# Patient Record
Sex: Female | Born: 1980 | Race: Black or African American | Hispanic: No | Marital: Married | Smoking: Never smoker
Health system: Southern US, Community
[De-identification: ages and names within clinical notes are randomized; demographics above are authoritative.]

## PROBLEM LIST (undated history)

## (undated) DIAGNOSIS — N390 Urinary tract infection, site not specified: Secondary | ICD-10-CM

## (undated) DIAGNOSIS — E669 Obesity, unspecified: Secondary | ICD-10-CM

## (undated) DIAGNOSIS — O039 Complete or unspecified spontaneous abortion without complication: Secondary | ICD-10-CM

## (undated) DIAGNOSIS — L309 Dermatitis, unspecified: Secondary | ICD-10-CM

## (undated) HISTORY — PX: TUBAL LIGATION: SHX77

---

## 2004-05-30 ENCOUNTER — Emergency Department (HOSPITAL_COMMUNITY): Admission: EM | Admit: 2004-05-30 | Discharge: 2004-05-30 | Payer: Self-pay | Admitting: Emergency Medicine

## 2005-02-09 ENCOUNTER — Emergency Department (HOSPITAL_COMMUNITY): Admission: EM | Admit: 2005-02-09 | Discharge: 2005-02-09 | Payer: Self-pay | Admitting: Emergency Medicine

## 2007-05-29 ENCOUNTER — Emergency Department (HOSPITAL_COMMUNITY): Admission: EM | Admit: 2007-05-29 | Discharge: 2007-05-29 | Payer: Self-pay | Admitting: Family Medicine

## 2007-12-04 ENCOUNTER — Emergency Department (HOSPITAL_COMMUNITY): Admission: EM | Admit: 2007-12-04 | Discharge: 2007-12-05 | Payer: Self-pay | Admitting: Emergency Medicine

## 2009-01-04 ENCOUNTER — Emergency Department (HOSPITAL_COMMUNITY): Admission: EM | Admit: 2009-01-04 | Discharge: 2009-01-04 | Payer: Self-pay | Admitting: Family Medicine

## 2009-02-21 ENCOUNTER — Emergency Department (HOSPITAL_COMMUNITY): Admission: EM | Admit: 2009-02-21 | Discharge: 2009-02-21 | Payer: Self-pay | Admitting: Family Medicine

## 2009-03-10 ENCOUNTER — Emergency Department (HOSPITAL_COMMUNITY): Admission: EM | Admit: 2009-03-10 | Discharge: 2009-03-10 | Payer: Self-pay | Admitting: Family Medicine

## 2009-04-08 ENCOUNTER — Emergency Department (HOSPITAL_COMMUNITY): Admission: EM | Admit: 2009-04-08 | Discharge: 2009-04-08 | Payer: Self-pay | Admitting: Family Medicine

## 2009-06-19 ENCOUNTER — Encounter: Admission: RE | Admit: 2009-06-19 | Discharge: 2009-06-19 | Payer: Self-pay | Admitting: Family Medicine

## 2009-09-16 ENCOUNTER — Emergency Department (HOSPITAL_BASED_OUTPATIENT_CLINIC_OR_DEPARTMENT_OTHER): Admission: EM | Admit: 2009-09-16 | Discharge: 2009-09-16 | Payer: Self-pay | Admitting: Emergency Medicine

## 2010-03-26 LAB — POCT URINALYSIS DIP (DEVICE)
Ketones, ur: NEGATIVE mg/dL
Nitrite: NEGATIVE
Specific Gravity, Urine: 1.025 (ref 1.005–1.030)
Urobilinogen, UA: 0.2 mg/dL (ref 0.0–1.0)

## 2010-03-26 LAB — WET PREP, GENITAL
Clue Cells Wet Prep HPF POC: NONE SEEN
Trich, Wet Prep: NONE SEEN
Yeast Wet Prep HPF POC: NONE SEEN

## 2010-03-26 LAB — HIV ANTIBODY (ROUTINE TESTING W REFLEX): HIV: NONREACTIVE

## 2010-03-26 LAB — GC/CHLAMYDIA PROBE AMP, GENITAL
Chlamydia, DNA Probe: NEGATIVE
GC Probe Amp, Genital: NEGATIVE

## 2010-03-26 LAB — POCT PREGNANCY, URINE: Preg Test, Ur: NEGATIVE

## 2010-03-26 LAB — RPR: RPR Ser Ql: NONREACTIVE

## 2010-03-30 LAB — GC/CHLAMYDIA PROBE AMP, GENITAL
Chlamydia, DNA Probe: NEGATIVE
GC Probe Amp, Genital: NEGATIVE

## 2010-03-30 LAB — POCT URINALYSIS DIP (DEVICE)
Bilirubin Urine: NEGATIVE
Nitrite: NEGATIVE
Specific Gravity, Urine: 1.02 (ref 1.005–1.030)

## 2010-04-07 LAB — WET PREP, GENITAL
Clue Cells Wet Prep HPF POC: NONE SEEN
Trich, Wet Prep: NONE SEEN

## 2010-04-07 LAB — GC/CHLAMYDIA PROBE AMP, GENITAL: GC Probe Amp, Genital: NEGATIVE

## 2010-04-07 LAB — POCT URINALYSIS DIP (DEVICE)
Hgb urine dipstick: NEGATIVE
Nitrite: NEGATIVE
pH: 5.5 (ref 5.0–8.0)

## 2010-04-08 ENCOUNTER — Inpatient Hospital Stay (INDEPENDENT_AMBULATORY_CARE_PROVIDER_SITE_OTHER)
Admission: RE | Admit: 2010-04-08 | Discharge: 2010-04-08 | Disposition: A | Discharge status: Home / Self Care | Payer: Medicaid Other | Source: Ambulatory Visit | Attending: Family Medicine | Admitting: Family Medicine

## 2010-04-08 DIAGNOSIS — N390 Urinary tract infection, site not specified: Secondary | ICD-10-CM

## 2010-04-08 LAB — POCT URINALYSIS DIP (DEVICE)
Bilirubin Urine: NEGATIVE
Glucose, UA: NEGATIVE mg/dL
Nitrite: NEGATIVE
Specific Gravity, Urine: >=1.03 (ref 1.005–1.030)
Urobilinogen, UA: 0.2 mg/dL (ref 0.0–1.0)
pH: 5.5 (ref 5.0–8.0)

## 2010-04-08 LAB — POCT PREGNANCY, URINE: Preg Test, Ur: NEGATIVE

## 2010-04-25 ENCOUNTER — Encounter (HOSPITAL_COMMUNITY)
Admission: RE | Admit: 2010-04-25 | Discharge: 2010-04-25 | Disposition: A | Discharge status: Home / Self Care | Payer: Medicaid Other | Source: Ambulatory Visit | Attending: Obstetrics and Gynecology | Admitting: Obstetrics and Gynecology

## 2010-04-25 DIAGNOSIS — Z01812 Encounter for preprocedural laboratory examination: Secondary | ICD-10-CM | POA: Insufficient documentation

## 2010-04-25 DIAGNOSIS — Z01818 Encounter for other preprocedural examination: Secondary | ICD-10-CM | POA: Insufficient documentation

## 2010-04-25 LAB — CBC
HCT: 35.9 % — ABNORMAL LOW (ref 36.0–46.0)
MCHC: 32 g/dL (ref 30.0–36.0)
Platelets: 267 10*3/uL (ref 150–400)
RDW: 13 % (ref 11.5–15.5)

## 2010-04-25 LAB — SURGICAL PCR SCREEN: Staphylococcus aureus: NEGATIVE

## 2010-05-02 ENCOUNTER — Ambulatory Visit (HOSPITAL_COMMUNITY)
Admission: AD | Admit: 2010-05-02 | Payer: Medicaid Other | Source: Ambulatory Visit | Admitting: Obstetrics and Gynecology

## 2010-10-01 LAB — WET PREP, GENITAL: Clue Cells Wet Prep HPF POC: NONE SEEN

## 2010-10-01 LAB — POCT URINALYSIS DIP (DEVICE)
Glucose, UA: NEGATIVE
Hgb urine dipstick: NEGATIVE
Nitrite: NEGATIVE
Protein, ur: NEGATIVE

## 2010-10-07 LAB — URINALYSIS, ROUTINE W REFLEX MICROSCOPIC
Leukocytes, UA: NEGATIVE
Nitrite: NEGATIVE
Specific Gravity, Urine: 1.033 — ABNORMAL HIGH
Urobilinogen, UA: 1
pH: 6.5

## 2010-10-07 LAB — COMPREHENSIVE METABOLIC PANEL
AST: 24
Albumin: 3.3 — ABNORMAL LOW
Alkaline Phosphatase: 28 — ABNORMAL LOW
Chloride: 108
Creatinine, Ser: 0.66
GFR calc Af Amer: >60
Potassium: 3.7
Total Bilirubin: 0.5
Total Protein: 6.6

## 2010-10-07 LAB — URINE MICROSCOPIC-ADD ON

## 2010-10-07 LAB — DIFFERENTIAL
Basophils Absolute: 0
Eosinophils Relative: 0
Lymphocytes Relative: 7 — ABNORMAL LOW
Monocytes Absolute: 0.6
Monocytes Relative: 7
Neutro Abs: 8.5 — ABNORMAL HIGH

## 2010-10-07 LAB — CBC
Platelets: 199
RDW: 12.1
WBC: 9.8

## 2010-10-07 LAB — POCT PREGNANCY, URINE: Preg Test, Ur: NEGATIVE

## 2011-09-16 ENCOUNTER — Emergency Department (HOSPITAL_BASED_OUTPATIENT_CLINIC_OR_DEPARTMENT_OTHER)
Admission: EM | Admit: 2011-09-16 | Discharge: 2011-09-16 | Disposition: A | Discharge status: Home / Self Care | Payer: Medicaid Other | Attending: Emergency Medicine | Admitting: Emergency Medicine

## 2011-09-16 ENCOUNTER — Emergency Department (HOSPITAL_BASED_OUTPATIENT_CLINIC_OR_DEPARTMENT_OTHER): Payer: Medicaid Other

## 2011-09-16 ENCOUNTER — Encounter (HOSPITAL_BASED_OUTPATIENT_CLINIC_OR_DEPARTMENT_OTHER): Payer: Self-pay | Admitting: *Deleted

## 2011-09-16 DIAGNOSIS — R109 Unspecified abdominal pain: Secondary | ICD-10-CM

## 2011-09-16 DIAGNOSIS — O2690 Pregnancy related conditions, unspecified, unspecified trimester: Secondary | ICD-10-CM | POA: Insufficient documentation

## 2011-09-16 DIAGNOSIS — Z3490 Encounter for supervision of normal pregnancy, unspecified, unspecified trimester: Secondary | ICD-10-CM

## 2011-09-16 LAB — WET PREP, GENITAL
Trich, Wet Prep: NONE SEEN
Yeast Wet Prep HPF POC: NONE SEEN

## 2011-09-16 LAB — ABO/RH: ABO/RH(D): O POS

## 2011-09-16 LAB — BASIC METABOLIC PANEL
CO2: 24 mEq/L (ref 19–32)
Calcium: 9.1 mg/dL (ref 8.4–10.5)
Creatinine, Ser: 0.7 mg/dL (ref 0.50–1.10)
GFR calc Af Amer: >90 mL/min (ref >90)
GFR calc non Af Amer: >90 mL/min (ref >90)
Sodium: 137 mEq/L (ref 135–145)

## 2011-09-16 LAB — CBC
MCH: 27.3 pg (ref 26.0–34.0)
MCHC: 32.3 g/dL (ref 30.0–36.0)
MCV: 84.4 fL (ref 78.0–100.0)
Platelets: 231 10*3/uL (ref 150–400)
RBC: 3.92 MIL/uL (ref 3.87–5.11)
RDW: 12.5 % (ref 11.5–15.5)

## 2011-09-16 NOTE — ED Provider Notes (Signed)
History     CSN: [id number]  Arrival date & time 09/16/11  1016   First MD Initiated Contact with Patient 09/16/11 1017      Chief Complaint  Patient presents with  . Vaginal Pain    The history is provided by the patient.   the patient reports mild discomfort in her vagina and that her cervix.  She reports this is worse over the past 12-24 hours.  She recently was on the maneuver ring for birth control but took it out and has not had a menstrual period since then.  She has had no new vaginal discharge.  She reports no dysuria or urinary frequency.  She's had no fevers or chills.  No lightheadedness or near syncope.  She reports her suprapubic abdominal tenderness is very mild.  No flank pain.  She has 3 living children.  History reviewed. No pertinent past medical history.  Past Surgical History  Procedure Date  . Cesarean section     x 3    No family history on file.  History  Substance Use Topics  . Smoking status: Never Smoker   . Smokeless tobacco: Not on file  . Alcohol Use: No    OB History    Grav Para Term Preterm Abortions TAB SAB Ect Mult Living                  Review of Systems  All other systems reviewed and are negative.    Allergies  Amoxicillin and Penicillins  Home Medications   Current Outpatient Rx  Name Route Sig Dispense Refill  . CETIRIZINE HCL 10 MG PO TABS Oral Take 10 mg by mouth daily.    . ETONOGESTREL-ETHINYL ESTRADIOL 0.12-0.015 MG/24HR VA RING Vaginal Place 1 each vaginally every 28 (twenty-eight) days. Insert vaginally and leave in place for 3 consecutive weeks, then remove for 1 week.    . MULTI-VITAMIN/MINERALS PO TABS Oral Take 1 tablet by mouth daily.      BP 128/82  Pulse 84  Temp 98.3 F (36.8 C) (Oral)  Resp 18  Ht 5\' 7"  (1.702 m)  Wt 280 lb (127.007 kg)  BMI 43.85 kg/m2  SpO2 99%  LMP 08/16/2011  Physical Exam  Nursing note and vitals reviewed. Constitutional: She is oriented to person, place, and time. She  appears well-developed and well-nourished. No distress.  HENT:  Head: Normocephalic and atraumatic.  Eyes: EOM are normal.  Neck: Normal range of motion.  Cardiovascular: Normal rate, regular rhythm and normal heart sounds.   Pulmonary/Chest: Effort normal and breath sounds normal.  Abdominal: Soft. She exhibits no distension. There is no tenderness.  Genitourinary:       Normal external genitalia.  No cervical motion tenderness.  No adnexal masses.  Scant vaginal discharge.  No foul odor.  No cervical lesions noted.  No external lesions noted  Musculoskeletal: Normal range of motion.  Neurological: She is alert and oriented to person, place, and time.  Skin: Skin is warm and dry.  Psychiatric: She has a normal mood and affect. Judgment normal.    ED Course  Procedures (including critical care time)  Labs Reviewed  PREGNANCY, URINE - Abnormal; Notable for the following:    Preg Test, Ur POSITIVE (*)     All other components within normal limits  WET PREP, GENITAL - Abnormal; Notable for the following:    Clue Cells Wet Prep HPF POC MODERATE (*)     WBC, Wet Prep HPF POC MODERATE (*)  All other components within normal limits  HCG, QUANTITATIVE, PREGNANCY - Abnormal; Notable for the following:    hCG, Beta Chain, Quant, S 2291 (*)     All other components within normal limits  CBC - Abnormal; Notable for the following:    Hemoglobin 10.7 (*)     HCT 33.1 (*)     All other components within normal limits  BASIC METABOLIC PANEL  ABO/RH  GC/CHLAMYDIA PROBE AMP, GENITAL   US Ob Comp Less 14 Wks  09/16/2011  *RADIOLOGY REPORT*  Clinical Data: VAGINAL PAIN + pregnancy,pain; ;  OBSTETRIC <14 WK [place] AND TRANSVAGINAL OB US  Technique: Both transabdominal and transvaginal ultrasound examinations were performed for complete evaluation of the gestation as well as the maternal uterus, adnexal regions, and pelvic cul-de-sac.  Comparison: None.  Findings: There is a single intrauterine  gestation.  Mean sac diameter is 7.1 mm for an estimated gestational age of [redacted] weeks 3 days.  No yolk sac or embryo currently.  No subchorionic hemorrhage.  Ovaries are difficult to visualize but grossly unremarkable.  No adnexal mass.  No free fluid.  IMPRESSION: 5-week-3-day intrauterine pregnancy.  No current yolk sac or fetal pole.  Recommend follow up in 10-14 days.   Original Report Authenticated By: Cyndie Chime, M.D.    US Ob Transvaginal  09/16/2011  *RADIOLOGY REPORT*  Clinical Data: VAGINAL PAIN + pregnancy,pain; ;  OBSTETRIC <14 WK [place] AND TRANSVAGINAL OB US  Technique: Both transabdominal and transvaginal ultrasound examinations were performed for complete evaluation of the gestation as well as the maternal uterus, adnexal regions, and pelvic cul-de-sac.  Comparison: None.  Findings: There is a single intrauterine gestation.  Mean sac diameter is 7.1 mm for an estimated gestational age of [redacted] weeks 3 days.  No yolk sac or embryo currently.  No subchorionic hemorrhage.  Ovaries are difficult to visualize but grossly unremarkable.  No adnexal mass.  No free fluid.  IMPRESSION: 5-week-3-day intrauterine pregnancy.  No current yolk sac or fetal pole.  Recommend follow up in 10-14 days.   Original Report Authenticated By: Cyndie Chime, M.D.      1. Intrauterine pregnancy   2. Abdominal pain       MDM  No vaginal bleeding.  No peritonitis on exam.  Vital signs normal.  No adnexal masses or free fluid noted.  Early first trimester pregnancy.  Normal bleeding precautions given.  Patient was told to followup at [facility] hospital for development of worsening abdominal pain or severe/heavy vaginal bleeding.  She understands importance of close OB/GYN followup.  She was told not to drink alcohol smoke cigarettes or use drugs.  She was told to take prenatal vitamin daily        Lyanne Co, MD 09/16/11 [phone]

## 2011-09-16 NOTE — ED Notes (Signed)
Patient states she had pain in her cervix area of her vagina.  Pain started yesterday and had progressively gotten worse over the last 24 hours.  States she has used a nuva ring for birth control and took out one week ago and has not had her menstrual cycle.  Denies discharge other than normal discharge.

## 2011-09-17 LAB — GC/CHLAMYDIA PROBE AMP, GENITAL
Chlamydia, DNA Probe: NEGATIVE
GC Probe Amp, Genital: NEGATIVE

## 2011-09-29 ENCOUNTER — Emergency Department (HOSPITAL_COMMUNITY)
Admission: EM | Admit: 2011-09-29 | Discharge: 2011-09-29 | Disposition: A | Discharge status: Home / Self Care | Payer: Medicaid Other | Source: Home / Self Care

## 2011-09-29 ENCOUNTER — Encounter (HOSPITAL_COMMUNITY): Payer: Self-pay | Admitting: Emergency Medicine

## 2011-09-29 DIAGNOSIS — N760 Acute vaginitis: Secondary | ICD-10-CM

## 2011-09-29 LAB — WET PREP, GENITAL: Yeast Wet Prep HPF POC: NONE SEEN

## 2011-09-29 MED ORDER — PRENATAL VITAMINS PLUS 27-1 MG PO TABS: 1.0000 | ORAL_TABLET | Freq: Every day | ORAL | Status: DC

## 2011-09-29 NOTE — ED Provider Notes (Signed)
History     CSN: [id number]  Arrival date & time 09/29/11  1035   None     Chief Complaint  Patient presents with  . Vaginitis    (Consider location/radiation/quality/duration/timing/severity/associated sxs/prior treatment) HPI Comments: 31 year old female presents with a complaint of vaginal discharge with an odor. States she's had this date cranial white discharge for at least 2 weeks. She has been trying Monistat suppositories for the same amount of time however there's been no improvement. Note worthy that she is approximately 9-[redacted] weeks gestation at this point. She was seen in the emergency department on 911 for complaints of "cervical pain". She was having low pelvic discomfort. At that time a sonogram was obtained where she was diagnosed with a pregnancy at 7 weeks. She was not treated with antibiotics as there was no apparent diagnosis of infection. Weber, and GC Chlamydia was performed and reported by the patient is negative. She is having vaginal discomfort associated with a discharge. Has been no vaginal bleeding and none seen today.   History reviewed. No pertinent past medical history.  Past Surgical History  Procedure Date  . Cesarean section     x 3    No family history on file.  History  Substance Use Topics  . Smoking status: Never Smoker   . Smokeless tobacco: Not on file  . Alcohol Use: No    OB History    Grav Para Term Preterm Abortions TAB SAB Ect Mult Living   1               Review of Systems  Constitutional: Negative for fever, activity change and fatigue.  Respiratory: Negative.   Gastrointestinal: Negative.   Genitourinary: Positive for vaginal discharge and vaginal pain. Negative for dysuria, flank pain, decreased urine volume, vaginal bleeding, difficulty urinating and menstrual problem.  Musculoskeletal: Negative.   Neurological: Negative.     Allergies  Amoxicillin and Penicillins  Home Medications   Current Outpatient Rx  Name  Route Sig Dispense Refill  . CETIRIZINE HCL 10 MG PO TABS Oral Take 10 mg by mouth daily.    . MULTI-VITAMIN/MINERALS PO TABS Oral Take 1 tablet by mouth daily.    . ETONOGESTREL-ETHINYL ESTRADIOL 0.12-0.015 MG/24HR VA RING Vaginal Place 1 each vaginally every 28 (twenty-eight) days. Insert vaginally and leave in place for 3 consecutive weeks, then remove for 1 week.    Marland Kitchen PRENATAL VITAMINS PLUS 27-1 MG PO TABS Oral Take 1 tablet by mouth daily. 1 tab po once daily 30 tablet 2    BP 118/77  Pulse 66  Temp 99.7 F (37.6 C) (Oral)  Resp 20  SpO2 99%  LMP 08/16/2011  Physical Exam  Constitutional: She is oriented to person, place, and time. She appears well-developed and well-nourished. No distress.  Neck: Normal range of motion. Neck supple.  Abdominal: Soft. There is no tenderness.  Genitourinary:       Pelvic exam: external normal; the vaginal vault and the cervix is covered with a copious amount of creamy white bubbly discharge. The cervix is midline and posterior no observed lesions. No blood is seen. Due to body habitus was unable to palpate the cervix. At the vaginal walls were tender.  Musculoskeletal: Normal range of motion.  Neurological: She is alert and oriented to person, place, and time.  Skin: Skin is warm and dry.  Psychiatric: She has a normal mood and affect.    ED Course  Procedures (including critical care time)  Labs Reviewed -  No data to display No results found.   1. Vaginitis       MDM  Rx prenatal vitamins to take one daily. Obtained wet prep GC and Chlamydia probe line She is apparently 9-[redacted] weeks pregnant therefore we'll await results of the results before treatment        Hayden Rasmussen, NP 09/29/11 1252

## 2011-09-29 NOTE — ED Notes (Signed)
Pt c/o poss yeast infection x2 weeks... Sx include: white discharge, itching, foul odor... Denies: fevers, nausea, vomiting, diarrhea... Has tried OTC monistat w/no relief.... Pt is [redacted] weeks pregnant.

## 2011-09-30 ENCOUNTER — Telehealth (HOSPITAL_COMMUNITY): Payer: Self-pay | Admitting: *Deleted

## 2011-09-30 NOTE — ED Notes (Signed)
Wet prep show moderate clue cells and few wbcs.  Rx for Flagyl received from [facility] and call the [facility] on [address].  Pt notifying of lab results and to pick up medication and take as direction.  Pt advised not to consume alcohol while taking Flagyl.  Pt voices understanding.

## 2011-10-01 NOTE — ED Provider Notes (Signed)
Medical screening examination/treatment/procedure(s) were performed by resident physician or non-physician practitioner and as supervising physician I was immediately available for consultation/collaboration.   Barkley Bruns MD.    Linna Hoff, MD 10/01/11 [phone]

## 2011-10-07 ENCOUNTER — Encounter (HOSPITAL_COMMUNITY)
Admission: AD | Disposition: A | Discharge status: Home / Self Care | Payer: Self-pay | Source: Ambulatory Visit | Attending: Obstetrics & Gynecology

## 2011-10-07 ENCOUNTER — Inpatient Hospital Stay (HOSPITAL_COMMUNITY): Payer: Medicaid Other

## 2011-10-07 ENCOUNTER — Encounter (HOSPITAL_COMMUNITY): Payer: Self-pay | Admitting: Anesthesiology

## 2011-10-07 ENCOUNTER — Ambulatory Visit (HOSPITAL_BASED_OUTPATIENT_CLINIC_OR_DEPARTMENT_OTHER)
Admission: AD | Admit: 2011-10-07 | Discharge: 2011-10-07 | Disposition: A | Discharge status: Home / Self Care | Payer: Medicaid Other | Source: Ambulatory Visit | Attending: Obstetrics & Gynecology | Admitting: Obstetrics & Gynecology

## 2011-10-07 ENCOUNTER — Inpatient Hospital Stay: Admit: 2011-10-07 | Payer: Self-pay | Admitting: Obstetrics & Gynecology

## 2011-10-07 ENCOUNTER — Encounter (HOSPITAL_BASED_OUTPATIENT_CLINIC_OR_DEPARTMENT_OTHER): Payer: Self-pay | Admitting: *Deleted

## 2011-10-07 ENCOUNTER — Inpatient Hospital Stay (HOSPITAL_COMMUNITY): Payer: Medicaid Other | Admitting: Anesthesiology

## 2011-10-07 DIAGNOSIS — O039 Complete or unspecified spontaneous abortion without complication: Secondary | ICD-10-CM

## 2011-10-07 DIAGNOSIS — O034 Incomplete spontaneous abortion without complication: Principal | ICD-10-CM | POA: Insufficient documentation

## 2011-10-07 HISTORY — PX: DILATION AND EVACUATION: SHX1459

## 2011-10-07 HISTORY — DX: Obesity, unspecified: E66.9

## 2011-10-07 HISTORY — DX: Dermatitis, unspecified: L30.9

## 2011-10-07 HISTORY — DX: Complete or unspecified spontaneous abortion without complication: O03.9

## 2011-10-07 LAB — CBC
HCT: 30.7 % — ABNORMAL LOW (ref 36.0–46.0)
MCV: 84.1 fL (ref 78.0–100.0)
RBC: 3.65 MIL/uL — ABNORMAL LOW (ref 3.87–5.11)
RDW: 12.6 % (ref 11.5–15.5)
WBC: 7.3 10*3/uL (ref 4.0–10.5)

## 2011-10-07 SURGERY — DILATION AND EVACUATION, UTERUS
Anesthesia: Monitor Anesthesia Care

## 2011-10-07 MED ORDER — DOXYCYCLINE HYCLATE 100 MG IV SOLR
100.0000 mg | Freq: Once | INTRAVENOUS | Status: AC
  Administered 2011-10-07: 100 mg via INTRAVENOUS
  Filled 2011-10-07: qty 100

## 2011-10-07 MED ORDER — HYDROMORPHONE HCL PF 1 MG/ML IJ SOLN
1.0000 mg | Freq: Once | INTRAMUSCULAR | Status: AC
  Administered 2011-10-07: 1 mg via INTRAVENOUS
  Filled 2011-10-07: qty 1

## 2011-10-07 MED ORDER — MEPERIDINE HCL 25 MG/ML IJ SOLN
INTRAMUSCULAR | Status: AC
  Filled 2011-10-07: qty 1

## 2011-10-07 MED ORDER — HYDROCODONE-ACETAMINOPHEN 5-500 MG PO TABS: 1.0000 | ORAL_TABLET | Freq: Four times a day (QID) | ORAL | Status: DC | PRN

## 2011-10-07 MED ORDER — DOXYCYCLINE HYCLATE 100 MG PO TABS: 100.0000 mg | ORAL_TABLET | Freq: Two times a day (BID) | ORAL | Status: DC

## 2011-10-07 MED ORDER — PROPOFOL 10 MG/ML IV EMUL
INTRAVENOUS | Status: DC | PRN
  Administered 2011-10-07: 50 mg via INTRAVENOUS
  Administered 2011-10-07 (×2): 100 mg via INTRAVENOUS
  Administered 2011-10-07: 25 mg via INTRAVENOUS

## 2011-10-07 MED ORDER — OXYTOCIN 10 UNIT/ML IJ SOLN
INTRAMUSCULAR | Status: DC | PRN
  Administered 2011-10-07: 20 [IU] via INTRAMUSCULAR

## 2011-10-07 MED ORDER — MIDAZOLAM HCL 5 MG/5ML IJ SOLN
INTRAMUSCULAR | Status: DC | PRN
  Administered 2011-10-07: 2 mg via INTRAVENOUS

## 2011-10-07 MED ORDER — MEPERIDINE HCL 25 MG/ML IJ SOLN
6.2500 mg | INTRAMUSCULAR | Status: DC | PRN
  Administered 2011-10-07: 6.25 mg via INTRAVENOUS

## 2011-10-07 MED ORDER — FENTANYL CITRATE 0.05 MG/ML IJ SOLN
INTRAMUSCULAR | Status: DC | PRN
  Administered 2011-10-07: 100 ug via INTRAVENOUS

## 2011-10-07 MED ORDER — BUPIVACAINE-EPINEPHRINE 0.5% -1:200000 IJ SOLN
INTRAMUSCULAR | Status: DC | PRN
  Administered 2011-10-07: 8 mL

## 2011-10-07 MED ORDER — ONDANSETRON HCL 4 MG/2ML IJ SOLN
4.0000 mg | Freq: Once | INTRAMUSCULAR | Status: AC
  Administered 2011-10-07: 4 mg via INTRAVENOUS
  Filled 2011-10-07: qty 2

## 2011-10-07 MED ORDER — CITRIC ACID-SODIUM CITRATE 334-500 MG/5ML PO SOLN
ORAL | Status: AC
  Filled 2011-10-07: qty 15

## 2011-10-07 MED ORDER — ONDANSETRON HCL 4 MG/2ML IJ SOLN
INTRAMUSCULAR | Status: DC | PRN
  Administered 2011-10-07: 4 mg via INTRAVENOUS

## 2011-10-07 MED ORDER — SODIUM CHLORIDE 0.9 % IV BOLUS (SEPSIS)
1000.0000 mL | Freq: Once | INTRAVENOUS | Status: AC
  Administered 2011-10-07: 1000 mL via INTRAVENOUS

## 2011-10-07 MED ORDER — LACTATED RINGERS IV SOLN
INTRAVENOUS | Status: DC | PRN
  Administered 2011-10-07: 21:00:00 via INTRAVENOUS

## 2011-10-07 MED ORDER — FENTANYL CITRATE 0.05 MG/ML IJ SOLN: 25.0000 ug | INTRAMUSCULAR | Status: DC | PRN

## 2011-10-07 MED ORDER — METOCLOPRAMIDE HCL 5 MG/ML IJ SOLN: 10.0000 mg | Freq: Once | INTRAMUSCULAR | Status: DC | PRN

## 2011-10-07 MED ORDER — KETOROLAC TROMETHAMINE 30 MG/ML IJ SOLN
INTRAMUSCULAR | Status: DC | PRN
  Administered 2011-10-07: 30 mg via INTRAVENOUS

## 2011-10-07 MED ORDER — CITRIC ACID-SODIUM CITRATE 334-500 MG/5ML PO SOLN
30.0000 mL | Freq: Once | ORAL | Status: DC
  Filled 2011-10-07: qty 30

## 2011-10-07 SURGICAL SUPPLY — 22 items
CATH ROBINSON RED A/P 16FR (CATHETERS) ×2 IMPLANT
CLOTH BEACON ORANGE TIMEOUT ST (SAFETY) ×2 IMPLANT
DECANTER SPIKE VIAL GLASS SM (MISCELLANEOUS) IMPLANT
DRESSING TELFA 8X3 (GAUZE/BANDAGES/DRESSINGS) ×2 IMPLANT
GLOVE BIO SURGEON STRL SZ 6.5 (GLOVE) ×2 IMPLANT
GLOVE BIOGEL PI IND STRL 7.0 (GLOVE) ×2 IMPLANT
GLOVE BIOGEL PI INDICATOR 7.0 (GLOVE) ×2
GOWN STRL REIN XL XLG (GOWN DISPOSABLE) ×4 IMPLANT
KIT BERKELEY 1ST TRIMESTER 3/8 (MISCELLANEOUS) IMPLANT
NDL SPNL 22GX3.5 QUINCKE BK (NEEDLE) IMPLANT
NEEDLE SPNL 22GX3.5 QUINCKE BK (NEEDLE) IMPLANT
PACK VAGINAL MINOR WOMEN LF (CUSTOM PROCEDURE TRAY) ×2 IMPLANT
PAD OB MATERNITY 4.3X12.25 (PERSONAL CARE ITEMS) ×2 IMPLANT
PAD PREP 24X48 CUFFED NSTRL (MISCELLANEOUS) ×2 IMPLANT
SET BERKELEY SUCTION TUBING (SUCTIONS) IMPLANT
SYR CONTROL 10ML LL (SYRINGE) IMPLANT
TOWEL OR 17X24 6PK STRL BLUE (TOWEL DISPOSABLE) ×4 IMPLANT
VACURETTE 10 RIGID CVD (CANNULA) IMPLANT
VACURETTE 7MM CVD STRL WRAP (CANNULA) IMPLANT
VACURETTE 8 RIGID CVD (CANNULA) IMPLANT
VACURETTE 9 RIGID CVD (CANNULA) IMPLANT
WATER STERILE IRR 1000ML POUR (IV SOLUTION) ×2 IMPLANT

## 2011-10-07 NOTE — ED Notes (Signed)
Pt states she was 7 weeks preg and stared with vaginal bleeding with clots on sat.  And lower  abd pain started this am

## 2011-10-07 NOTE — Op Note (Signed)
Procedure: Suction dilation and curettage Preoperative diagnosis: Incomplete miscarriage at [redacted] weeks gestation Postoperative diagnosis: Same Surgeon: Dr. Scheryl Darter Anesthesia: MAC. by Dr. Cristela Blue and local intracervical block Specimen: Products of conception Complications: None Drains: None Estimated blood loss negligible Counts: Correct  Patient gave written consent for dilation and curettage due to incomplete miscarriage at [redacted] weeks gestation. Patient identification was confirmed and she was brought to the operating room and MAC anesthesia was induced. His placed in dorsal lithotomy position. Foley catheter was in place. Perineum and vagina were sterilely prepped and draped. Exam revealed a approximately 7-8 week size uterus cervix and a half centimeter open. Speculum was inserted and half percent Marcaine with 1: 200000 epinephrine was infiltrated. The cervix was grasped with single-tooth tenaculum. Cervix dilated sufficiently to pass a 9 mm suction curette suction curettage was performed with small amount of products of conception obtained. Complete evacuation was assured. She received IV Pitocin during the procedure. All instruments removed. There was minimal bleeding. She was brought in stable condition to PACU.  Ellen Hunter  10/07/2011 9:21 PM

## 2011-10-07 NOTE — Progress Notes (Signed)
Received report from S. Beck Charity fundraiser. Introduced self to pt and explained plan of care. Informed that DR Debroah Loop would be in to do an exam.

## 2011-10-07 NOTE — MAU Provider Note (Addendum)
History     CSN: [id number]  Arrival date and time: 10/07/11 1316   First Provider Initiated Contact with Patient 10/07/11 1650      Chief Complaint  Patient presents with  . Vaginal Bleeding   HPI Ellen Hunter is a 31 y.o. female [id number] Patient's last menstrual period was 08/16/2011. who presents to MAU with vaginal bleeding. She was evaluated at [facility] and transferred here for further evaluation. While at [facility] her pain was 10/10 and she was given Dilaudid and pain went down to 3/10. Hx of SAB a 4 days ago. She continued to have bleeding and then today the pain got much worse and the bleeding became heavy..   OB History    Grav Para Term Preterm Abortions TAB SAB Ect Mult Living   1               History reviewed. No pertinent past medical history.  Past Surgical History  Procedure Date  . Cesarean section     x 3    History reviewed. No pertinent family history.  History  Substance Use Topics  . Smoking status: Never Smoker   . Smokeless tobacco: Not on file  . Alcohol Use: No    Allergies:  Allergies  Allergen Reactions  . Amoxicillin Hives  . Blueberry Flavor Hives  . Penicillins Hives    Prescriptions prior to admission  Medication Sig Dispense Refill  . cetirizine (ZYRTEC) 10 MG tablet Take 10 mg by mouth daily.      Marland Kitchen etonogestrel-ethinyl estradiol (NUVARING) 0.12-0.015 MG/24HR vaginal ring Place 1 each vaginally every 28 (twenty-eight) days. Insert vaginally and leave in place for 3 consecutive weeks, then remove for 1 week.      Marland Kitchen ibuprofen (ADVIL,MOTRIN) 200 MG tablet Take 400 mg by mouth every 8 (eight) hours as needed. For pain      . metroNIDAZOLE (FLAGYL) 500 MG tablet Take 500 mg by mouth 2 (two) times daily. For 7 days started on 09-30-2011      . Prenatal Vit-Fe Fumarate-FA (PRENATAL FORMULA PO) Take 1 tablet by mouth daily. [facility] [place] [facility]  Could not find in data e        Review of  Systems  Constitutional: Negative for fever, chills and weight loss.  HENT: Negative for ear pain, nosebleeds, congestion, sore throat and neck pain.   Eyes: Negative for blurred vision, double vision, photophobia and pain.  Respiratory: Negative for cough, shortness of breath and wheezing.   Cardiovascular: Negative for chest pain, palpitations and leg swelling.  Gastrointestinal: Positive for abdominal pain. Negative for heartburn, nausea, vomiting, diarrhea and constipation.  Genitourinary: Negative for dysuria, urgency and frequency.       Heavy vaginal bleeding  Musculoskeletal: Negative for myalgias and back pain.  Skin: Negative for itching and rash (eczema).  Neurological: Positive for headaches. Negative for dizziness, sensory change, speech change, seizures and weakness.  Endo/Heme/Allergies: Does not bruise/bleed easily.  Psychiatric/Behavioral: Negative for depression. The patient is not nervous/anxious and does not have insomnia.    Blood pressure 104/56, pulse 69, temperature 97.3 F (36.3 C), temperature source Oral, resp. rate 16, height 5\' 7"  (1.702 m), weight 285 lb (129.275 kg), last menstrual period 08/16/2011, SpO2 99.00%, unknown if currently breastfeeding.  Physical Exam  Nursing note and vitals reviewed. Constitutional: She is oriented to person, place, and time. No distress.       obese  HENT:  Head: Normocephalic  and atraumatic.  Eyes: EOM are normal.  Neck: Neck supple.  Cardiovascular: Normal rate.   Respiratory: Effort normal.  GI: Soft. There is generalized tenderness. There is no rigidity and no guarding.       Increased tenderness lower abdomen with palpation.  Genitourinary:       Moderate blood in vault, cervix 1/2 cm open, minimal tenderness, uterus 6 weeks size, no masses  Musculoskeletal: Normal range of motion.  Neurological: She is alert and oriented to person, place, and time.  Skin: Skin is warm and dry.  Psychiatric: She has a normal mood  and affect. Her behavior is normal. Judgment and thought content normal.   Results for orders placed during the hospital encounter of 10/07/11 (from the past 24 hour(s))  CBC     Status: Abnormal   Collection Time   10/07/11  2:10 PM      Component Value Range   WBC 7.3  4.0 - 10.5 K/uL   RBC 3.65 (*) 3.87 - 5.11 MIL/uL   Hemoglobin 10.1 (*) 12.0 - 15.0 g/dL   HCT 16.1 (*) 09.6 - 04.5 %   MCV 84.1  78.0 - 100.0 fL   MCH 27.7  26.0 - 34.0 pg   MCHC 32.9  30.0 - 36.0 g/dL   RDW 40.9  81.1 - 91.4 %   Platelets 234  150 - 400 K/uL  ABO/RH     Status: Normal   Collection Time   10/07/11  2:10 PM      Component Value Range   ABO/RH(D) O POS    HCG, QUANTITATIVE, PREGNANCY     Status: Abnormal   Collection Time   10/07/11  2:10 PM      Component Value Range   hCG, Beta Chain, Quant, S 113 (*) <5 mIU/mL   US Transvaginal Non-ob  10/07/2011  *RADIOLOGY REPORT*  Clinical Data: Status post spontaneous abortion on 10/03/2011. Persistent bleeding.  TRANSVAGINAL ULTRASOUND OF PELVIS Technique:  Transvaginal ultrasound examination of the pelvis was performed including evaluation of the uterus, ovaries, adnexal regions, and pelvic cul-de-sac.  Comparison:  None.  Findings:  Uterus:  11.3 x 6.5 x 6.4 cm.No evidence for fibroids.  Endometrium: 2.6 cm in double layer thickness.  The endometrial lining is heterogeneous in the lower uterine segment.  Right ovary: 2.7 x 3.4 x 2.3 cm.  No adnexal mass.  Left ovary: Poorly visualized.  No evidence for adnexal mass.  Other Findings:  No free fluid  IMPRESSION: Thickened heterogeneous endometrial canal with possible fluid in the canal of the lower uterine segment.  This may be related to blood products.  No definite findings to suggest retained products of conception, but this etiology cannotbe completely excluded by ultrasound.  .   Original Report Authenticated By: ERIC A. MANSELL, M.D.    MAU Course: Dr. Debroah Loop notified of ultrasound results and will come to MAU  to evaluate the patient.  Procedures A/P   Probable incomplete abortion. Discussed option of expectant management, cytotec, dilation and curettage. The procedure was explained and the risks of anesthesia, bleeding, infection, pelvic organ damage. Questions were answered and she accepts D and C.   Umar Patmon 7:53 PM  NEESE,HOPE, RN, FNP, [facility] 10/07/2011, 4:53 PM

## 2011-10-07 NOTE — Transfer of Care (Signed)
Immediate Anesthesia Transfer of Care Note  Patient: Ellen Hunter  Procedure(s) Performed: Procedure(s) (LRB) with comments: DILATATION AND EVACUATION (N/A)  Patient Location: PACU  Anesthesia Type: MAC  Level of Consciousness: awake, alert , oriented and patient cooperative  Airway & Oxygen Therapy: Patient Spontanous Breathing  Post-op Assessment: Report given to PACU RN and Post -op Vital signs reviewed and stable  Post vital signs: Reviewed and stable  Complications: No apparent anesthesia complications

## 2011-10-07 NOTE — MAU Note (Signed)
Pt states miscarried Saturday, pain started out very intense, had urge to push but couldn't. Went to [facility] and was then transferred here. LMP-08/16/2011. Prior miscarriage. Had noted abnormal vaginal discharge and went to UC last Wednesday, told she had bv, given meds for that. 800mg  ibuprofen did not help pain at all.

## 2011-10-07 NOTE — ED Notes (Signed)
[facility] at bedside for transport. 

## 2011-10-07 NOTE — H&P (Signed)
Author: Adam Phenix, MD Service: Obstetrics Author Type: Physician    Filed: 10/07/11 1957 Note Time: 10/07/11 1653        Related Notes: Original Note by: Adam Phenix, MD filed at 10/07/11 1953          History        CSN: [id number]   Arrival date and time: 10/07/11 1316    First Provider Initiated Contact with Patient 10/07/11 1650          Chief Complaint   Patient presents with   .  Vaginal Bleeding      HPI Ellen Hunter is a 31 y.o. female [id number] Patient's last menstrual period was 08/16/2011. who presents to MAU with vaginal bleeding. She was evaluated at [facility] and transferred here for further evaluation. While at [facility] her pain was 10/10 and she was given Dilaudid and pain went down to 3/10. Hx of SAB a 4 days ago. She continued to have bleeding and then today the pain got much worse and the bleeding became heavy..     OB History      Grav  Para  Term  Preterm  Abortions  TAB  SAB  Ect  Mult  Living     1                             History reviewed. No pertinent past medical history.    Past Surgical History   Procedure  Date   .  Cesarean section         x 3        History reviewed. No pertinent family history.    History   Substance Use Topics   .  Smoking status:  Never Smoker    .  Smokeless tobacco:  Not on file   .  Alcohol Use:  No        Allergies:   Allergies   Allergen  Reactions   .  Amoxicillin  Hives   .  Blueberry Flavor  Hives   .  Penicillins  Hives         Prescriptions prior to admission   Medication  Sig  Dispense  Refill   .  cetirizine (ZYRTEC) 10 MG tablet  Take 10 mg by mouth daily.         Marland Kitchen  etonogestrel-ethinyl estradiol (NUVARING) 0.12-0.015 MG/24HR vaginal ring  Place 1 each vaginally every 28 (twenty-eight) days. Insert vaginally and leave in place for 3 consecutive weeks, then remove for 1 week.         Marland Kitchen  ibuprofen (ADVIL,MOTRIN) 200 MG tablet  Take 400 mg by mouth every 8 (eight) hours as needed.  For pain         .  metroNIDAZOLE (FLAGYL) 500 MG tablet  Take 500 mg by mouth 2 (two) times daily. For 7 days started on 09-30-2011         .  Prenatal Vit-Fe Fumarate-FA (PRENATAL FORMULA PO)  Take 1 tablet by mouth daily. [facility] [place] [facility]  Could not find in data  e              Review of Systems  Constitutional: Negative for fever, chills and weight loss.  HENT: Negative for ear pain, nosebleeds, congestion, sore throat and neck pain.   Eyes: Negative for blurred vision, double vision, photophobia and pain.  Respiratory: Negative for cough, shortness of breath and wheezing.   Cardiovascular: Negative for chest pain, palpitations and leg swelling.  Gastrointestinal: Positive for abdominal pain. Negative for heartburn, nausea, vomiting, diarrhea and constipation.  Genitourinary: Negative for dysuria, urgency and frequency.        Heavy vaginal bleeding  Musculoskeletal: Negative for myalgias and back pain.  Skin: Negative for itching and rash (eczema).  Neurological: Positive for headaches. Negative for dizziness, sensory change, speech change, seizures and weakness.  Endo/Heme/Allergies: Does not bruise/bleed easily.  Psychiatric/Behavioral: Negative for depression. The patient is not nervous/anxious and does not have insomnia.     Blood pressure 104/56, pulse 69, temperature 97.3 F (36.3 C), temperature source Oral, resp. rate 16, height 5\' 7"  (1.702 m), weight 285 lb (129.275 kg), last menstrual period 08/16/2011, SpO2 99.00%, unknown if currently breastfeeding.   Physical Exam  Nursing note and vitals reviewed. Constitutional: She is oriented to person, place, and time. No distress.       obese  HENT:   Head: Normocephalic and atraumatic.  Eyes: EOM are normal.  Neck: Neck supple.  Cardiovascular: Normal rate.   Respiratory: Effort normal.  GI: Soft. There is generalized tenderness. There is no rigidity and no  guarding.       Increased tenderness lower abdomen with palpation.  Genitourinary:       Moderate blood in vault, cervix 1/2 cm open, minimal tenderness, uterus 6 weeks size, no masses  Musculoskeletal: Normal range of motion.  Neurological: She is alert and oriented to person, place, and time.  Skin: Skin is warm and dry.  Psychiatric: She has a normal mood and affect. Her behavior is normal. Judgment and thought content normal.     Results for orders placed during the hospital encounter of 10/07/11 (from the past 24 hour(s))   CBC     Status: Abnormal     Collection Time     10/07/11  2:10 PM       Component  Value  Range     WBC  7.3   4.0 - 10.5 K/uL     RBC  3.65 (*)  3.87 - 5.11 MIL/uL     Hemoglobin  10.1 (*)  12.0 - 15.0 g/dL     HCT  16.1 (*)  09.6 - 46.0 %     MCV  84.1   78.0 - 100.0 fL     MCH  27.7   26.0 - 34.0 pg     MCHC  32.9   30.0 - 36.0 g/dL     RDW  04.5   40.9 - 15.5 %     Platelets  234   150 - 400 K/uL   ABO/RH     Status: Normal     Collection Time     10/07/11  2:10 PM       Component  Value  Range     ABO/RH(D)  O POS      HCG, QUANTITATIVE, PREGNANCY     Status: Abnormal     Collection Time     10/07/11  2:10 PM       Component  Value  Range     hCG, Beta Chain, Quant, S  113 (*)  <5 mIU/mL      US Transvaginal Non-ob   10/07/2011  *RADIOLOGY REPORT*  Clinical Data: Status post spontaneous abortion on 10/03/2011. Persistent bleeding.  TRANSVAGINAL ULTRASOUND OF PELVIS Technique:  Transvaginal ultrasound examination of the pelvis was performed including evaluation  of the uterus, ovaries, adnexal regions, and pelvic cul-de-sac.  Comparison:  None.  Findings:  Uterus:  11.3 x 6.5 x 6.4 cm.No evidence for fibroids.  Endometrium: 2.6 cm in double layer thickness.  The endometrial lining is heterogeneous in the lower uterine segment.  Right ovary: 2.7 x 3.4 x 2.3 cm.  No adnexal mass.  Left ovary: Poorly visualized.  No evidence for adnexal mass.  Other  Findings:  No free fluid  IMPRESSION: Thickened heterogeneous endometrial canal with possible fluid in the canal of the lower uterine segment.  This may be related to blood products.  No definite findings to suggest retained products of conception, but this etiology cannotbe completely excluded by ultrasound.  .   Original Report Authenticated By: ERIC A. MANSELL, M.D.       MAU Course: Dr. Debroah Loop notified of ultrasound results and will come to MAU to evaluate the patient.    Procedures A/P   Probable incomplete abortion. Discussed option of expectant management, cytotec, dilation and curettage. The procedure was explained and the risks of anesthesia, bleeding, infection, pelvic organ damage. Questions were answered and she accepts D and C.    Scherrie Seneca 7:53 PM

## 2011-10-07 NOTE — ED Notes (Signed)
Report called to [facility] MAU RN

## 2011-10-07 NOTE — Anesthesia Preprocedure Evaluation (Signed)
Anesthesia Evaluation  Patient identified by MRN, date of birth, ID band Patient awake    Reviewed: Allergy & Precautions, H&P , NPO status , Patient's Chart, lab work & pertinent test results  Airway Mallampati: III TM Distance: >3 FB Neck ROM: Full    Dental No notable dental hx. (+) Teeth Intact   Pulmonary asthma ,  breath sounds clear to auscultation  Pulmonary exam normal       Cardiovascular negative cardio ROS  Rhythm:Regular Rate:Normal     Neuro/Psych negative neurological ROS  negative psych ROS   GI/Hepatic negative GI ROS, Neg liver ROS,   Endo/Other  Morbid obesity  Renal/GU negative Renal ROS  negative genitourinary   Musculoskeletal   Abdominal (+) + obese,   Peds  Hematology   Anesthesia Other Findings   Reproductive/Obstetrics (+) Pregnancy Missed Ab                           Anesthesia Physical Anesthesia Plan  ASA: III and Emergent  Anesthesia Plan: MAC and General   Post-op Pain Management:    Induction: Intravenous  Airway Management Planned: Natural Airway  Additional Equipment:   Intra-op Plan:   Post-operative Plan:   Informed Consent: I have reviewed the patients History and Physical, chart, labs and discussed the procedure including the risks, benefits and alternatives for the proposed anesthesia with the patient or authorized representative who has indicated his/her understanding and acceptance.   Dental advisory given  Plan Discussed with: CRNA, Anesthesiologist and Surgeon  Anesthesia Plan Comments:         Anesthesia Quick Evaluation

## 2011-10-07 NOTE — ED Provider Notes (Signed)
History     CSN: [id number]  Arrival date & time 10/07/11  1316   First MD Initiated Contact with Patient 10/07/11 1328      Chief Complaint  Patient presents with  . Vaginal Bleeding    (Consider location/radiation/quality/duration/timing/severity/associated sxs/prior treatment) HPI g4p3 lmp august 11,2013, US done here with 5 w 3 d iup seen on 9/11, now with symptoms of miscarriage began last week with pressure and cramping like with menstrual cycle, Saturday had vaginal bleeding and clots, Monday had increased bleeding.  Patient states changing pads hourly.  Patient states last seen at [facility] outpatient clinic.  Patient with pelvic pain worse today and taking ibuprofen without relief.  Patient states light headed but complaining of continued severe pain beginning today.   History reviewed. No pertinent past medical history.  Past Surgical History  Procedure Date  . Cesarean section     x 3    History reviewed. No pertinent family history.  History  Substance Use Topics  . Smoking status: Never Smoker   . Smokeless tobacco: Not on file  . Alcohol Use: No    OB History    Grav Para Term Preterm Abortions TAB SAB Ect Mult Living   1               Review of Systems  Constitutional: Negative for fever, chills, activity change, appetite change and unexpected weight change.  HENT: Negative for sore throat, rhinorrhea, neck pain, neck stiffness and sinus pressure.   Eyes: Negative for visual disturbance.  Respiratory: Negative for cough and shortness of breath.   Cardiovascular: Negative for chest pain and leg swelling.  Gastrointestinal: Negative for vomiting, abdominal pain, diarrhea and blood in stool.  Genitourinary: Negative for dysuria, urgency, frequency, vaginal discharge and difficulty urinating.  Musculoskeletal: Negative for myalgias, arthralgias and gait problem.  Skin: Negative for color change and rash.  Neurological: Negative for weakness, light-headedness  and headaches.  Hematological: Does not bruise/bleed easily.  Psychiatric/Behavioral: Negative for dysphoric mood.    Allergies  Amoxicillin and Penicillins  Home Medications   Current Outpatient Rx  Name Route Sig Dispense Refill  . CETIRIZINE HCL 10 MG PO TABS Oral Take 10 mg by mouth daily.    . ETONOGESTREL-ETHINYL ESTRADIOL 0.12-0.015 MG/24HR VA RING Vaginal Place 1 each vaginally every 28 (twenty-eight) days. Insert vaginally and leave in place for 3 consecutive weeks, then remove for 1 week.    . MULTI-VITAMIN/MINERALS PO TABS Oral Take 1 tablet by mouth daily.    Marland Kitchen PRENATAL VITAMINS PLUS 27-1 MG PO TABS Oral Take 1 tablet by mouth daily. 1 tab po once daily 30 tablet 2    BP 138/98  Pulse 81  Temp 98 F (36.7 C) (Oral)  Resp 18  Ht 5\' 7"  (1.702 m)  Wt 285 lb (129.275 kg)  BMI 44.64 kg/m2  SpO2 100%  LMP 08/16/2011  Physical Exam  Nursing note and vitals reviewed. Constitutional: She appears well-developed and well-nourished.  HENT:  Head: Normocephalic and atraumatic.  Eyes: Conjunctivae normal and EOM are normal. Pupils are equal, round, and reactive to light.  Neck: Normal range of motion. Neck supple.  Cardiovascular: Normal rate, regular rhythm, normal heart sounds and intact distal pulses.   Pulmonary/Chest: Effort normal and breath sounds normal.  Abdominal: Soft. Bowel sounds are normal.  Genitourinary: Uterus is enlarged and tender. There is bleeding around the vagina.  Musculoskeletal: Normal range of motion.  Neurological: She is alert.  Skin: Skin is warm and  dry.  Psychiatric: She has a normal mood and affect. Thought content normal.    ED Course  Procedures (including critical care time)  Labs Reviewed - No data to display No results found.   No diagnosis found.   Patient hemodynamically stable  Patient's care discussed with Dr. Debroah Loop and patient to be transferred to [facility] hospital for evaluation for possible i and d.         Hilario Quarry, MD 10/07/11 [phone]

## 2011-10-07 NOTE — Progress Notes (Signed)
Moderate amount bleeding noted. Plan for D&C as per Dr Debroah Loop.

## 2011-10-07 NOTE — Anesthesia Postprocedure Evaluation (Signed)
  Anesthesia Post-op Note  Patient: Ellen Hunter  Procedure(s) Performed: Procedure(s) (LRB) with comments: DILATATION AND EVACUATION (N/A)  Patient Location: PACU  Anesthesia Type: MAC  Level of Consciousness: awake, alert  and oriented  Airway and Oxygen Therapy: Patient Spontanous Breathing  Post-op Pain: none  Post-op Assessment: Post-op Vital signs reviewed, Patient's Cardiovascular Status Stable, Respiratory Function Stable, Patent Airway, No signs of Nausea or vomiting and Pain level controlled  Post-op Vital Signs: Reviewed and stable  Complications: No apparent anesthesia complications

## 2011-10-09 ENCOUNTER — Encounter (HOSPITAL_COMMUNITY): Payer: Self-pay | Admitting: Obstetrics & Gynecology

## 2011-10-27 ENCOUNTER — Encounter (HOSPITAL_BASED_OUTPATIENT_CLINIC_OR_DEPARTMENT_OTHER): Payer: Self-pay | Admitting: Emergency Medicine

## 2011-10-27 ENCOUNTER — Emergency Department (HOSPITAL_BASED_OUTPATIENT_CLINIC_OR_DEPARTMENT_OTHER)
Admission: EM | Admit: 2011-10-27 | Discharge: 2011-10-27 | Disposition: A | Discharge status: Home / Self Care | Payer: Medicaid Other | Attending: Emergency Medicine | Admitting: Emergency Medicine

## 2011-10-27 DIAGNOSIS — Z872 Personal history of diseases of the skin and subcutaneous tissue: Secondary | ICD-10-CM | POA: Insufficient documentation

## 2011-10-27 DIAGNOSIS — E669 Obesity, unspecified: Secondary | ICD-10-CM | POA: Insufficient documentation

## 2011-10-27 DIAGNOSIS — Z79899 Other long term (current) drug therapy: Secondary | ICD-10-CM | POA: Insufficient documentation

## 2011-10-27 DIAGNOSIS — Z791 Long term (current) use of non-steroidal anti-inflammatories (NSAID): Secondary | ICD-10-CM | POA: Insufficient documentation

## 2011-10-27 DIAGNOSIS — N390 Urinary tract infection, site not specified: Secondary | ICD-10-CM

## 2011-10-27 HISTORY — DX: Urinary tract infection, site not specified: N39.0

## 2011-10-27 LAB — URINALYSIS, ROUTINE W REFLEX MICROSCOPIC
Glucose, UA: NEGATIVE mg/dL
Protein, ur: NEGATIVE mg/dL
Specific Gravity, Urine: 1.031 — ABNORMAL HIGH (ref 1.005–1.030)
pH: 5.5 (ref 5.0–8.0)

## 2011-10-27 LAB — URINE MICROSCOPIC-ADD ON

## 2011-10-27 MED ORDER — PHENAZOPYRIDINE HCL 200 MG PO TABS: 200.0000 mg | ORAL_TABLET | Freq: Three times a day (TID) | ORAL | Status: DC

## 2011-10-27 MED ORDER — CIPROFLOXACIN HCL 500 MG PO TABS
500.0000 mg | ORAL_TABLET | Freq: Once | ORAL | Status: AC
  Administered 2011-10-27: 500 mg via ORAL
  Filled 2011-10-27 (×2): qty 1

## 2011-10-27 MED ORDER — CIPROFLOXACIN HCL 500 MG PO TABS: 500.0000 mg | ORAL_TABLET | Freq: Two times a day (BID) | ORAL | Status: DC

## 2011-10-27 MED ORDER — PHENAZOPYRIDINE HCL 100 MG PO TABS
200.0000 mg | ORAL_TABLET | Freq: Once | ORAL | Status: AC
  Administered 2011-10-27: 200 mg via ORAL
  Filled 2011-10-27 (×2): qty 2

## 2011-10-27 NOTE — ED Notes (Signed)
Pt with dysuria starting last Thursday, drank some cranberry juice and symptoms improved and today pt developed dysuria and urgency

## 2011-10-27 NOTE — ED Provider Notes (Signed)
History     CSN: [id number]  Arrival date & time 10/27/11  2006   First MD Initiated Contact with Patient 10/27/11 2137      Chief Complaint  Patient presents with  . Urinary Frequency    HPI The patient presents to the emergency room with complaints of dysuria. It started last Thursday. She tried to drink some cranberry juice and the symptoms improved somewhat however they returned today. She has not had any fever, vomiting or back pain. She has had urinary frequency associated with her symptoms. Patient also mentions that she had a miscarriage a couple weeks ago. She has still had some vaginal discharge associated with that but has plans to follow up with a gynecologist next week. Past Medical History  Diagnosis Date  . Miscarriage   . Eczema   . Obese   . Urinary tract infection, site not specified     Past Surgical History  Procedure Date  . Cesarean section     x 3  . Dilation and evacuation 10/07/2011    Procedure: DILATATION AND EVACUATION;  Surgeon: Adam Phenix, MD;  Location: [facility];  Service: Gynecology;  Laterality: N/A;    Family History  Problem Relation Age of Onset  . Hypertension Mother   . Diabetes Mother     History  Substance Use Topics  . Smoking status: Never Smoker   . Smokeless tobacco: Never Used  . Alcohol Use: No    OB History    Grav Para Term Preterm Abortions TAB SAB Ect Mult Living   7 3 3  3 1 2   3       Review of Systems  All other systems reviewed and are negative.    Allergies  Amoxicillin; Blueberry flavor; and Penicillins  Home Medications   Current Outpatient Rx  Name Route Sig Dispense Refill  . CETIRIZINE HCL 10 MG PO TABS Oral Take 10 mg by mouth daily.    . IBUPROFEN 200 MG PO TABS Oral Take 400 mg by mouth every 8 (eight) hours as needed. For pain    . DOXYCYCLINE HYCLATE 100 MG PO TABS Oral Take 1 tablet (100 mg total) by mouth 2 (two) times daily. 4 tablet 0  . ETONOGESTREL-ETHINYL ESTRADIOL 0.12-0.015  MG/24HR VA RING Vaginal Place 1 each vaginally every 28 (twenty-eight) days. Insert vaginally and leave in place for 3 consecutive weeks, then remove for 1 week.    Marland Kitchen HYDROCODONE-ACETAMINOPHEN 5-500 MG PO TABS Oral Take 1 tablet by mouth every 6 (six) hours as needed for pain. 12 tablet 0  . METRONIDAZOLE 500 MG PO TABS Oral Take 500 mg by mouth 2 (two) times daily. For 7 days started on 09-30-2011    . PRENATAL FORMULA PO Oral Take 1 tablet by mouth daily. [facility] [place] [facility]  Could not find in data e      BP 136/82  Pulse 70  Temp 97.9 F (36.6 C) (Oral)  Resp 20  Ht 5\' 7"  (1.702 m)  Wt 280 lb (127.007 kg)  BMI 43.85 kg/m2  SpO2 100%  LMP 08/16/2011  Breastfeeding? No  Physical Exam  Nursing note and vitals reviewed. Constitutional: She appears well-developed and well-nourished. No distress.  HENT:  Head: Normocephalic and atraumatic.  Right Ear: External ear normal.  Left Ear: External ear normal.  Eyes: Conjunctivae normal are normal. Right eye exhibits no discharge. Left eye exhibits no discharge. No scleral icterus.  Neck:  Neck supple. No tracheal deviation present.  Cardiovascular: Normal rate, regular rhythm and intact distal pulses.   Pulmonary/Chest: Effort normal and breath sounds normal. No stridor. No respiratory distress. She has no wheezes. She has no rales.  Abdominal: Soft. Bowel sounds are normal. She exhibits no distension. There is no tenderness. There is no rebound and no guarding.  Musculoskeletal: She exhibits no edema and no tenderness.  Neurological: She is alert. She has normal strength. No sensory deficit. Cranial nerve deficit:  no gross defecits noted. She exhibits normal muscle tone. She displays no seizure activity. Coordination normal.  Skin: Skin is warm and dry. No rash noted.  Psychiatric: She has a normal mood and affect.    ED Course  Procedures (including critical care time)  Labs Reviewed   URINALYSIS, ROUTINE W REFLEX MICROSCOPIC - Abnormal; Notable for the following:    APPearance CLOUDY (*)     Specific Gravity, Urine 1.031 (*)     Hgb urine dipstick TRACE (*)     Leukocytes, UA LARGE (*)     All other components within normal limits  URINE MICROSCOPIC-ADD ON - Abnormal; Notable for the following:    Squamous Epithelial / LPF FEW (*)     Bacteria, UA FEW (*)     All other components within normal limits   No results found.    MDM  The patient's symptoms and urinalysis are consistent with a urinary tract infection. The patient does have plans to follow up with the gynecologist regarding her recent miscarriage.  She is not having any abdominal pain or pelvic pain.  Celene Kras, MD 10/27/11 2147

## 2011-10-27 NOTE — ED Notes (Signed)
Pt recently had misscarge and was [facility], since that time she has had difficulty voiding and s/s of UTI, pt also has continued to have a thick discharge

## 2011-10-30 LAB — URINE CULTURE

## 2011-10-31 NOTE — ED Notes (Signed)
+  Urine. Patient given Cipro. Sensitive to same. Per protocol MD. °

## 2013-04-15 IMAGING — US US OB TRANSVAGINAL
1 series · 14 of 28 positions shown · non-contrast
Comparison: None.

CLINICAL DATA: VAGINAL PAIN + pregnancy,pain; ;

OBSTETRIC <14 WK US AND TRANSVAGINAL OB US
TECHNIQUE: Both transabdominal and transvaginal ultrasound
examinations were performed for complete evaluation of the
gestation as well as the maternal uterus, adnexal regions, and
pelvic cul-de-sac.

[Series 1: us ob transvaginal · 0.26mm/px · 14 of 41 slices shown]
[im 2/41]
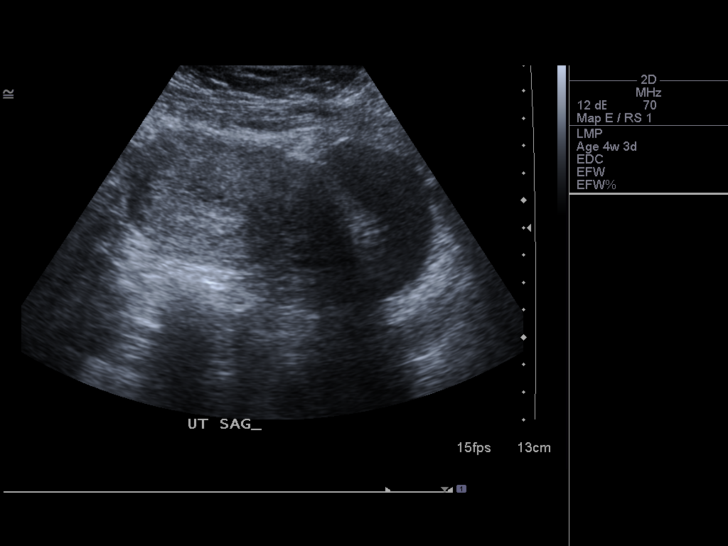
[im 5/41]
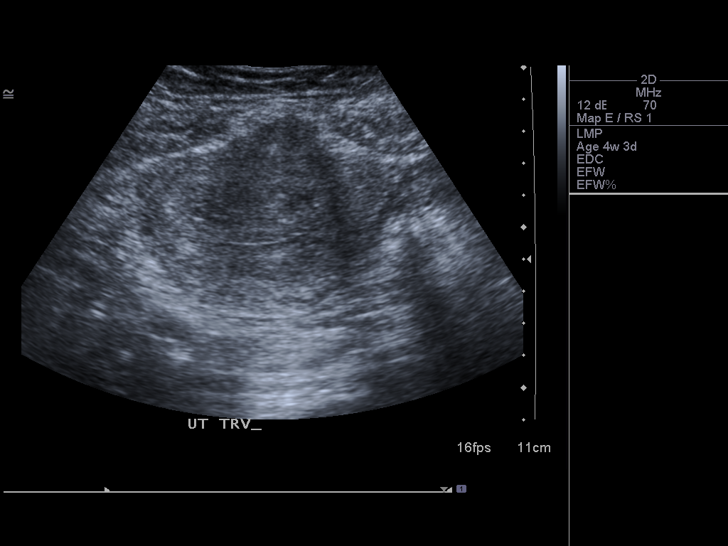
[im 8/41]
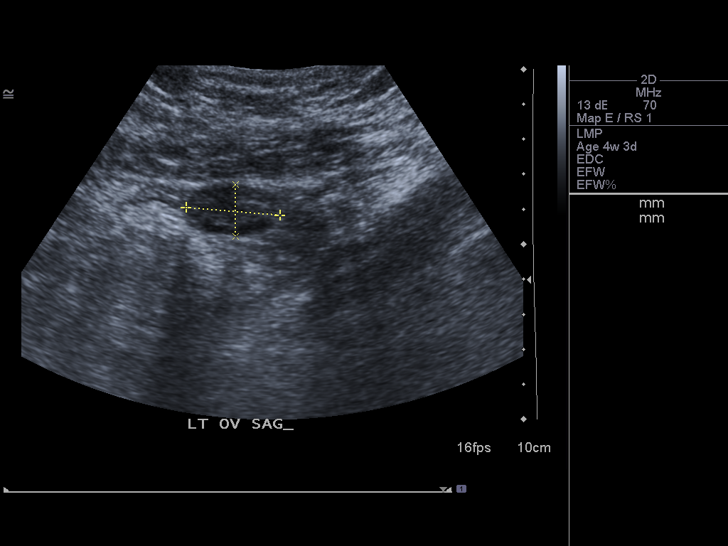
[im 11/41]
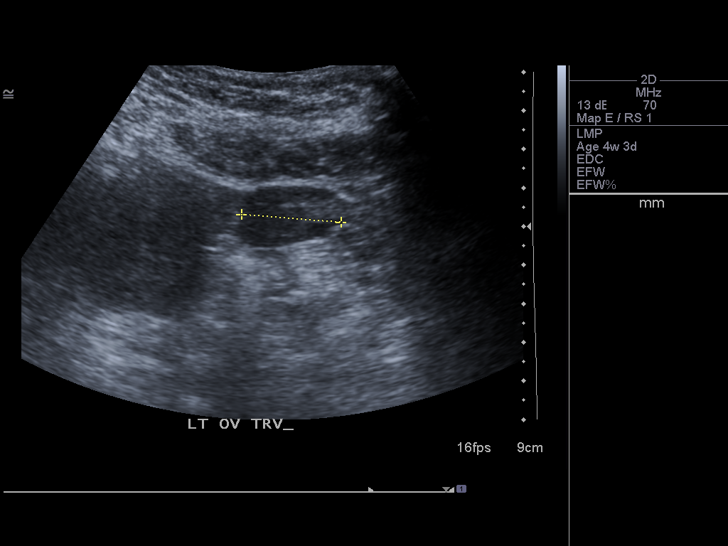
[im 14/41]
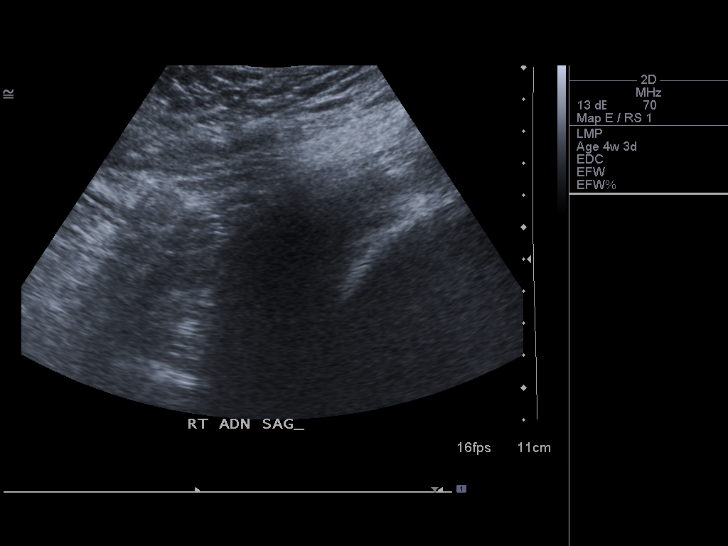
[im 17/41]
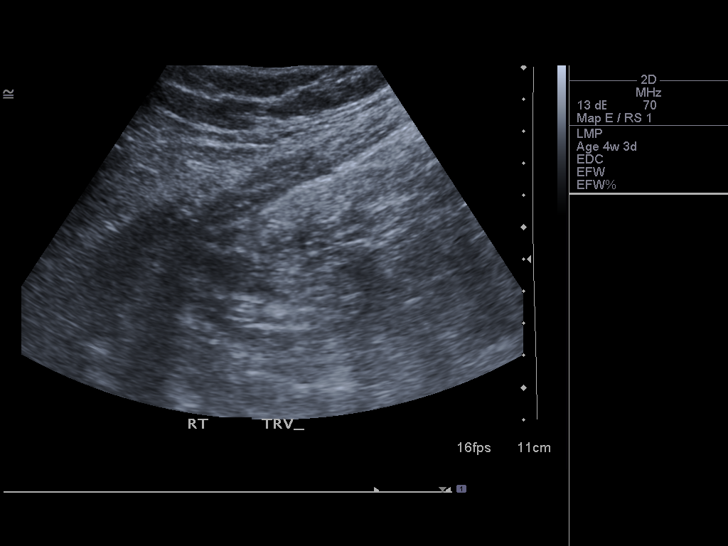
[im 20/41]
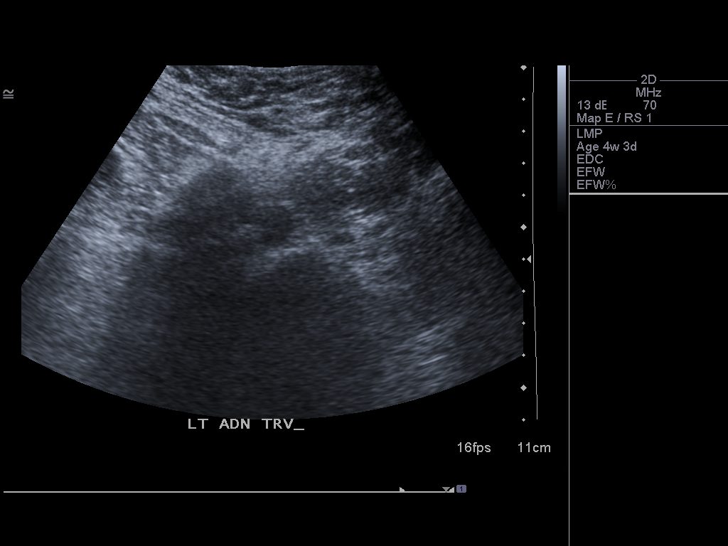
[im 23/41]
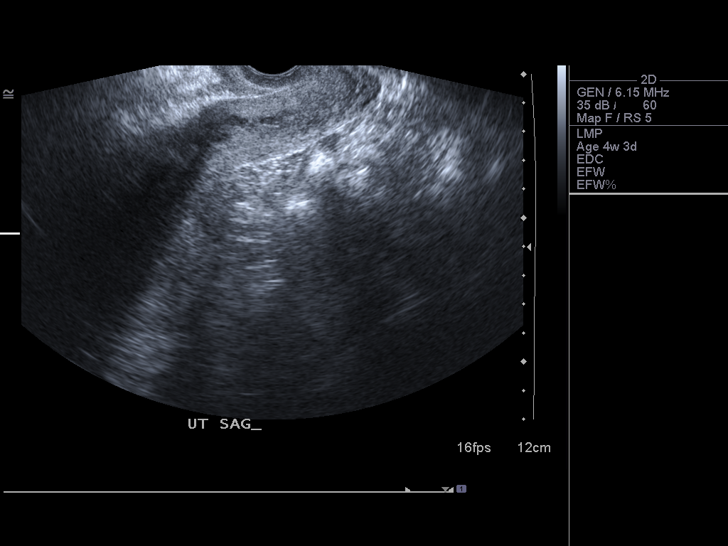
[im 26/41]
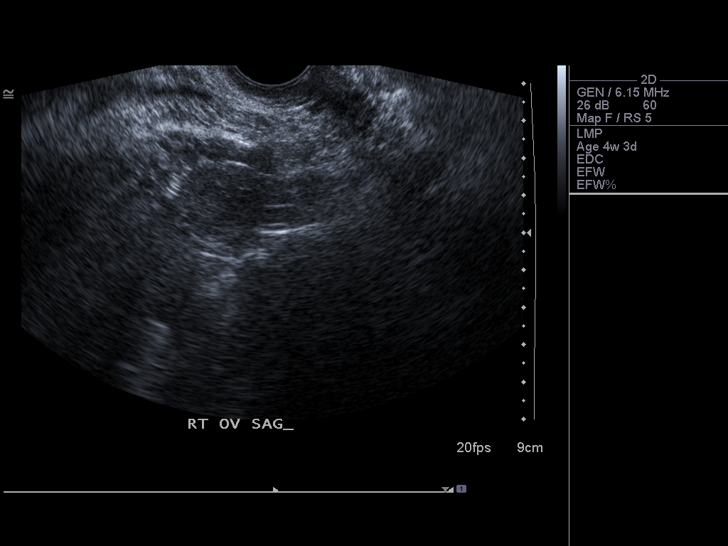
[im 29/41]
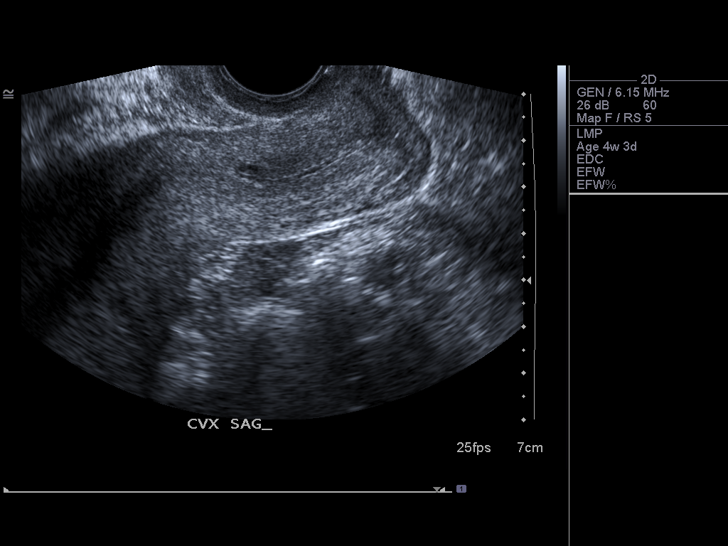
[im 32/41]
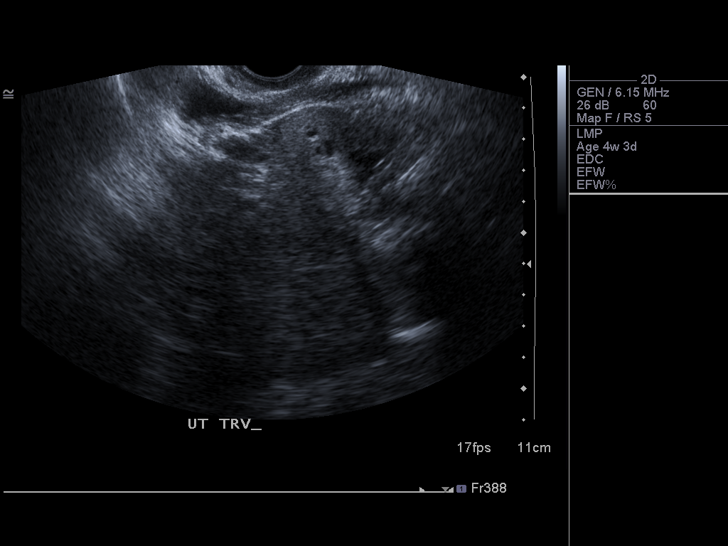
[im 35/41]
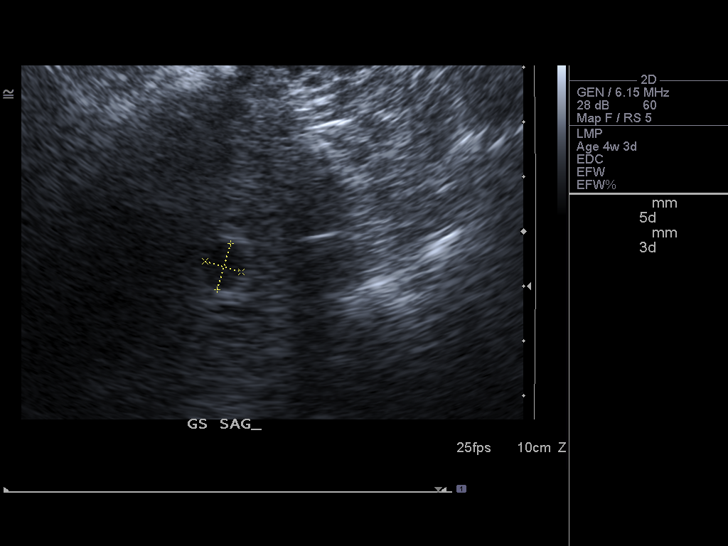
[im 38/41]
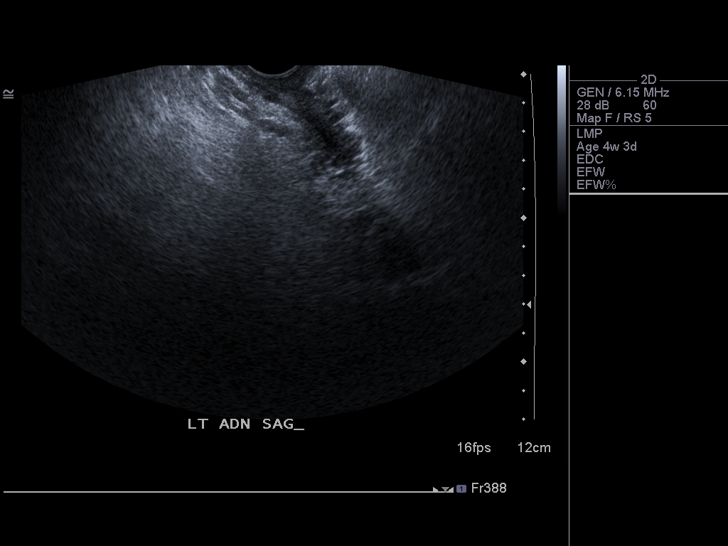
[im 41/41]
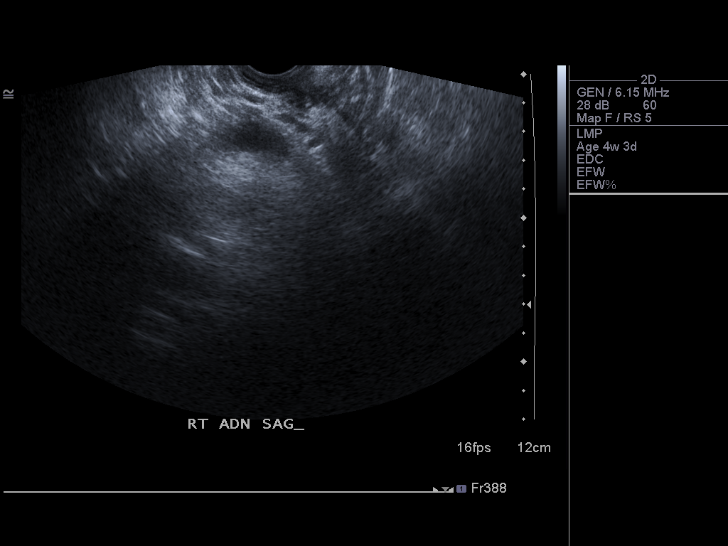

[14 of 28 positions shown; findings below may reference images not displayed]

FINDINGS: There is a single intrauterine gestation.  Mean sac
diameter is 7.1 mm for an estimated gestational age of 5 weeks 3
days.  No yolk sac or embryo currently.  No subchorionic
hemorrhage.

Ovaries are difficult to visualize but grossly unremarkable.  No
adnexal mass.  No free fluid.
IMPRESSION: 5-week-8-day intrauterine pregnancy.  No current yolk sac or fetal
pole.  Recommend follow up in 10-14 days.

## 2013-09-08 ENCOUNTER — Other Ambulatory Visit (HOSPITAL_COMMUNITY)
Admission: RE | Admit: 2013-09-08 | Discharge: 2013-09-08 | Disposition: A | Discharge status: Home / Self Care | Payer: Medicaid Other | Source: Ambulatory Visit | Attending: Emergency Medicine | Admitting: Emergency Medicine

## 2013-09-08 ENCOUNTER — Encounter (HOSPITAL_COMMUNITY): Payer: Self-pay | Admitting: Emergency Medicine

## 2013-09-08 ENCOUNTER — Emergency Department (INDEPENDENT_AMBULATORY_CARE_PROVIDER_SITE_OTHER)
Admission: EM | Admit: 2013-09-08 | Discharge: 2013-09-08 | Disposition: A | Discharge status: Home / Self Care | Payer: Self-pay | Source: Home / Self Care | Attending: Emergency Medicine | Admitting: Emergency Medicine

## 2013-09-08 DIAGNOSIS — Z113 Encounter for screening for infections with a predominantly sexual mode of transmission: Secondary | ICD-10-CM | POA: Diagnosis present

## 2013-09-08 DIAGNOSIS — N760 Acute vaginitis: Secondary | ICD-10-CM | POA: Diagnosis present

## 2013-09-08 DIAGNOSIS — B9689 Other specified bacterial agents as the cause of diseases classified elsewhere: Secondary | ICD-10-CM

## 2013-09-08 DIAGNOSIS — A499 Bacterial infection, unspecified: Secondary | ICD-10-CM

## 2013-09-08 LAB — POCT URINALYSIS DIP (DEVICE)
Bilirubin Urine: NEGATIVE
GLUCOSE, UA: NEGATIVE mg/dL
Hgb urine dipstick: NEGATIVE
Ketones, ur: NEGATIVE mg/dL
LEUKOCYTES UA: NEGATIVE
NITRITE: NEGATIVE
Protein, ur: NEGATIVE mg/dL
SPECIFIC GRAVITY, URINE: 1.02 (ref 1.005–1.030)
UROBILINOGEN UA: 0.2 mg/dL (ref 0.0–1.0)
pH: 6.5 (ref 5.0–8.0)

## 2013-09-08 LAB — POCT PREGNANCY, URINE: PREG TEST UR: NEGATIVE

## 2013-09-08 MED ORDER — METRONIDAZOLE 500 MG PO TABS: 500.0000 mg | ORAL_TABLET | Freq: Two times a day (BID) | ORAL | Status: DC

## 2013-09-08 NOTE — Discharge Instructions (Signed)
To restore the normal balance of "good bacteria" in your system.  Take a probiotic once daily.  These can be gotten over the counter at the drug store without a prescription and come under various brand names such as Culturelle, Align, Florastore, and Phillips.  The best thing to do is to ask your pharmacist to recommend a good probiotic that is not too expensive.  ° ° ° °Bacterial Vaginosis °Bacterial vaginosis is a vaginal infection that occurs when the normal balance of bacteria in the vagina is disrupted. It results from an overgrowth of certain bacteria. This is the most common vaginal infection in women of childbearing age. Treatment is important to prevent complications, especially in pregnant women, as it can cause a premature delivery. °CAUSES  °Bacterial vaginosis is caused by an increase in harmful bacteria that are normally present in smaller amounts in the vagina. Several different kinds of bacteria can cause bacterial vaginosis. However, the reason that the condition develops is not fully understood. °RISK FACTORS °Certain activities or behaviors can put you at an increased risk of developing bacterial vaginosis, including: °· Having a new sex partner or multiple sex partners. °· Douching. °· Using an intrauterine device (IUD) for contraception. °Women do not get bacterial vaginosis from toilet seats, bedding, swimming pools, or contact with objects around them. °SIGNS AND SYMPTOMS  °Some women with bacterial vaginosis have no signs or symptoms. Common symptoms include: °· Grey vaginal discharge. °· A fishlike odor with discharge, especially after sexual intercourse. °· Itching or burning of the vagina and vulva. °· Burning or pain with urination. °DIAGNOSIS  °Your health care provider will take a medical history and examine the vagina for signs of bacterial vaginosis. A sample of vaginal fluid may be taken. Your health care provider will look at this sample under a microscope to check for bacteria and  abnormal cells. A vaginal pH test may also be done.  °TREATMENT  °Bacterial vaginosis may be treated with antibiotic medicines. These may be given in the form of a pill or a vaginal cream. A second round of antibiotics may be prescribed if the condition comes back after treatment.  °HOME CARE INSTRUCTIONS  °· Only take over-the-counter or prescription medicines as directed by your health care provider. °· If antibiotic medicine was prescribed, take it as directed. Make sure you finish it even if you start to feel better. °· Do not have sex until treatment is completed. °· Tell all sexual partners that you have a vaginal infection. They should see their health care provider and be treated if they have problems, such as a mild rash or itching. °· Practice safe sex by using condoms and only having one sex partner. °SEEK MEDICAL CARE IF:  °· Your symptoms are not improving after 3 days of treatment. °· You have increased discharge or pain. °· You have a fever. °MAKE SURE YOU:  °· Understand these instructions. °· Will watch your condition. °· Will get help right away if you are not doing well or get worse. °FOR MORE INFORMATION  °Centers for Disease Control and Prevention, Division of STD Prevention: www.cdc.gov/std °American Sexual Health Association (ASHA): www.ashastd.org  °Document Released: 12/22/2004 Document Revised: 10/12/2012 Document Reviewed: 08/03/2012 °Exit[employer]® Patient Information ©2015 [employer]. This information is not intended to replace advice given to you by your health care provider. Make sure you discuss any questions you have with your health care provider. ° °

## 2013-09-08 NOTE — ED Provider Notes (Signed)
Chief Complaint   Chief Complaint  Patient presents with  . Vaginal Discharge    History of Present Illness   Ellen Hunter is a 33 year old female who has had a two-week history of vaginal discharge and older. She has slight suprapubic pain. She denies any lower back pain. She has had no vaginal itching. No external lesions. She denies fever, chills, nausea, vomiting, or urinary symptoms. She does have a history of bacterial vaginosis in the past. Her current menses have been going on for about 2 weeks. Her menses have been irregular since the birth of her 82-month-old child. She had a tubal ligation right after her child was born. She is sexually active.  Review of Systems   Other than as noted above, the patient denies any of the following symptoms: Systemic:  No fever or chills GI:  No abdominal pain, nausea, vomiting, diarrhea, constipation, melena or hematochezia. GU:  No dysuria, frequency, urgency, hematuria, vaginal discharge, itching, or abnormal vaginal bleeding.  PMFSH   Past medical history, family history, social history, meds, and allergies were reviewed.  She is allergic to amoxicillin. She takes a prenatal vitamin and is nursing.  Physical Examination    Vital signs:  BP 140/80  Pulse 60  Temp(Src) 98.4 F (36.9 C) (Oral)  SpO2 100% General:  Alert, oriented and in no distress. Lungs:  Breath sounds clear and equal bilaterally.  No wheezes, rales or rhonchi. Heart:  Regular rhythm.  No gallops or murmers. Abdomen:  Soft, flat and non-distended.  No organomegaly or mass.  No tenderness, guarding or rebound.  Bowel sounds normally active. Pelvic exam:  Normal external genitalia. She has a moderate amount of malodorous vaginal discharge and some brown bleeding coming from the cervical os. There was no pain on cervical motion. Uterus was normal in size and shape. No adnexal masses or tenderness.  DNA probes for gonorrhea, Chlamydia, Trichomonas, Gardnerella, Candida  were obtained. Skin:  Clear, warm and dry.  Chaperoned by Michiel Cowboy, RN who was present throughout the pelvic exam.   Labs   Results for orders placed during the hospital encounter of 09/08/13  POCT URINALYSIS DIP (DEVICE)      Result Value Ref Range   Glucose, UA NEGATIVE  NEGATIVE mg/dL   Bilirubin Urine NEGATIVE  NEGATIVE   Ketones, ur NEGATIVE  NEGATIVE mg/dL   Specific Gravity, Urine 1.020  1.005 - 1.030   Hgb urine dipstick NEGATIVE  NEGATIVE   pH 6.5  5.0 - 8.0   Protein, ur NEGATIVE  NEGATIVE mg/dL   Urobilinogen, UA 0.2  0.0 - 1.0 mg/dL   Nitrite NEGATIVE  NEGATIVE   Leukocytes, UA NEGATIVE  NEGATIVE  POCT PREGNANCY, URINE      Result Value Ref Range   Preg Test, Ur NEGATIVE  NEGATIVE    Assessment   The encounter diagnosis was Bacterial vaginosis.  Suggested probiotics for prevention recurrences. Irregular menses as well need to be checked and given the name of [facility] hospital clinics for GYN followup. Metronidazole is safe to use for nursing mothers, she has had good results with this, and does not want to use a gel or cream.       Plan    1.  Meds:  The following meds were prescribed:   Discharge Medication List as of 09/08/2013  8:09 PM    START taking these medications   Details  !! metroNIDAZOLE (FLAGYL) 500 MG tablet Take 1 tablet (500 mg total) by mouth 2 (two)  times daily., Starting 09/08/2013, Until Discontinued, Normal     !! - Potential duplicate medications found. Please discuss with provider.      2.  Patient Education/Counseling:  The patient was given appropriate handouts, self care instructions, and instructed in symptomatic relief.    3.  Follow up:  The patient was told to follow up here if no better in 3 to 4 days, or sooner if becoming worse in any way, and given some red flag symptoms such as worsening pain, fever, persistent vomiting, or heavy vaginal bleeding which would prompt immediate return.       Reuben Likes, MD 09/08/13  [phone]

## 2013-09-08 NOTE — ED Notes (Signed)
Concern about vaginal d/c

## 2013-09-13 NOTE — ED Notes (Signed)
GC/Chlamydia neg., Affirm: Candida and Trich neg, Gardnerella pos. Pt. adequately treated with Flagyl. Eliya Bubar M 09/13/2013  

## 2013-09-13 NOTE — Progress Notes (Signed)
Quick Note:  Results are abnormal as noted, but have been adequately treated. No further action necessary. ______ 

## 2013-11-06 ENCOUNTER — Encounter (HOSPITAL_COMMUNITY): Payer: Self-pay | Admitting: Emergency Medicine

## 2013-11-08 ENCOUNTER — Emergency Department (HOSPITAL_BASED_OUTPATIENT_CLINIC_OR_DEPARTMENT_OTHER)
Admission: EM | Admit: 2013-11-08 | Discharge: 2013-11-08 | Disposition: A | Discharge status: Home / Self Care | Payer: Medicaid Other | Attending: Emergency Medicine | Admitting: Emergency Medicine

## 2013-11-08 ENCOUNTER — Encounter (HOSPITAL_BASED_OUTPATIENT_CLINIC_OR_DEPARTMENT_OTHER): Payer: Self-pay

## 2013-11-08 DIAGNOSIS — Z3202 Encounter for pregnancy test, result negative: Secondary | ICD-10-CM | POA: Insufficient documentation

## 2013-11-08 DIAGNOSIS — Z792 Long term (current) use of antibiotics: Secondary | ICD-10-CM | POA: Insufficient documentation

## 2013-11-08 DIAGNOSIS — N390 Urinary tract infection, site not specified: Secondary | ICD-10-CM

## 2013-11-08 DIAGNOSIS — E669 Obesity, unspecified: Secondary | ICD-10-CM | POA: Insufficient documentation

## 2013-11-08 DIAGNOSIS — Z79899 Other long term (current) drug therapy: Secondary | ICD-10-CM | POA: Insufficient documentation

## 2013-11-08 DIAGNOSIS — Z880 Allergy status to penicillin: Secondary | ICD-10-CM | POA: Insufficient documentation

## 2013-11-08 DIAGNOSIS — Z872 Personal history of diseases of the skin and subcutaneous tissue: Secondary | ICD-10-CM | POA: Insufficient documentation

## 2013-11-08 DIAGNOSIS — B9689 Other specified bacterial agents as the cause of diseases classified elsewhere: Secondary | ICD-10-CM

## 2013-11-08 DIAGNOSIS — N760 Acute vaginitis: Secondary | ICD-10-CM | POA: Insufficient documentation

## 2013-11-08 LAB — WET PREP, GENITAL
Trich, Wet Prep: NONE SEEN
Yeast Wet Prep HPF POC: NONE SEEN

## 2013-11-08 LAB — URINALYSIS, ROUTINE W REFLEX MICROSCOPIC
Bilirubin Urine: NEGATIVE
Glucose, UA: NEGATIVE mg/dL
KETONES UR: NEGATIVE mg/dL
NITRITE: NEGATIVE
PH: 5 (ref 5.0–8.0)
PROTEIN: 100 mg/dL — AB
Specific Gravity, Urine: 1.031 — ABNORMAL HIGH (ref 1.005–1.030)
UROBILINOGEN UA: 0.2 mg/dL (ref 0.0–1.0)

## 2013-11-08 LAB — URINE MICROSCOPIC-ADD ON

## 2013-11-08 LAB — PREGNANCY, URINE: Preg Test, Ur: NEGATIVE

## 2013-11-08 MED ORDER — METRONIDAZOLE 500 MG PO TABS: 500.0000 mg | ORAL_TABLET | Freq: Two times a day (BID) | ORAL | Status: DC

## 2013-11-08 MED ORDER — NITROFURANTOIN MONOHYD MACRO 100 MG PO CAPS: 100.0000 mg | ORAL_CAPSULE | Freq: Two times a day (BID) | ORAL | Status: DC

## 2013-11-08 NOTE — ED Provider Notes (Signed)
CSN: [id number]     Arrival date & time 11/08/13  1525 History   First MD Initiated Contact with Patient 11/08/13 1538     Chief Complaint  Patient presents with  . Dysuria     (Consider location/radiation/quality/duration/timing/severity/associated sxs/prior Treatment) HPI Comments: C/o urinary frequency and urgency. Denies fever, back pain or abdominal pain. Does have some discharge but states that it seems consistent with previous bv. Nothing makes the symptoms better or worse. Hasn't tried any medications at home  The history is provided by the patient. No language interpreter was used.    Past Medical History  Diagnosis Date  . Miscarriage   . Eczema   . Obese   . Urinary tract infection, site not specified    Past Surgical History  Procedure Laterality Date  . Cesarean section      x 3  . Dilation and evacuation  10/07/2011    Procedure: DILATATION AND EVACUATION;  Surgeon: Ellen PhenixJames G Arnold, MD;  Location: [facility];  Service: Gynecology;  Laterality: N/A;  . Tubal ligation     Family History  Problem Relation Age of Onset  . Hypertension Mother   . Diabetes Mother    History  Substance Use Topics  . Smoking status: Never Smoker   . Smokeless tobacco: Never Used  . Alcohol Use: No   OB History    Gravida Para Term Preterm AB TAB SAB Ectopic Multiple Living   7 3 3  3 1 2   3      Review of Systems  All other systems reviewed and are negative.     Allergies  Amoxicillin; Blueberry flavor; and Penicillins  Home Medications   Prior to Admission medications   Medication Sig Start Date End Date Taking? Authorizing Provider  cetirizine (ZYRTEC) 10 MG tablet Take 10 mg by mouth daily.    Historical Provider, MD  ciprofloxacin (CIPRO) 500 MG tablet Take 1 tablet (500 mg total) by mouth 2 (two) times daily. 10/27/11   Ellen DibblesJon Knapp, MD  doxycycline (VIBRA-TABS) 100 MG tablet Take 1 tablet (100 mg total) by mouth 2 (two) times daily. 10/07/11   Ellen PhenixJames G Arnold, MD   etonogestrel-ethinyl estradiol (NUVARING) 0.12-0.015 MG/24HR vaginal ring Place 1 each vaginally every 28 (twenty-eight) days. Insert vaginally and leave in place for 3 consecutive weeks, then remove for 1 week.    Historical Provider, MD  HYDROcodone-acetaminophen (VICODIN) 5-500 MG per tablet Take 1 tablet by mouth every 6 (six) hours as needed for pain. 10/07/11   Ellen PhenixJames G Arnold, MD  ibuprofen (ADVIL,MOTRIN) 200 MG tablet Take 400 mg by mouth every 8 (eight) hours as needed. For pain    Historical Provider, MD  metroNIDAZOLE (FLAGYL) 500 MG tablet Take 500 mg by mouth 2 (two) times daily. For 7 days started on 09-30-2011    Historical Provider, MD  metroNIDAZOLE (FLAGYL) 500 MG tablet Take 1 tablet (500 mg total) by mouth 2 (two) times daily. 09/08/13   Ellen Likesavid C Keller, MD  phenazopyridine (PYRIDIUM) 200 MG tablet Take 1 tablet (200 mg total) by mouth 3 (three) times daily. 10/27/11   Ellen DibblesJon Knapp, MD  Prenatal Vit-Fe Fumarate-FA (PRENATAL FORMULA PO) Take 1 tablet by mouth daily. [facility] [place] [facility]  Could not find in data e    Historical Provider, MD   BP 137/73 mmHg  Pulse 98  Temp(Src) 98 F (36.7 C) (Oral)  Resp 20  Ht 5\' 7"  (1.702 m)  Wt 276 lb (125.193 kg)  BMI 43.22 kg/m2  SpO2 99%  LMP 11/02/2013 Physical Exam  Constitutional: She is oriented to person, place, and time. She appears well-developed and well-nourished.  Cardiovascular: Normal rate and regular rhythm.   Pulmonary/Chest: Effort normal and breath sounds normal.  Abdominal: Soft. Bowel sounds are normal. There is no tenderness.  Genitourinary:  White discharge  Musculoskeletal: Normal range of motion.  Neurological: She is alert and oriented to person, place, and time.  Skin: Skin is warm and dry.  Nursing note and vitals reviewed.   ED Course  Procedures (including critical care time) Labs Review Labs Reviewed  WET PREP, GENITAL - Abnormal; Notable for the  following:    Clue Cells Wet Prep HPF POC TOO NUMEROUS TO COUNT (*)    WBC, Wet Prep HPF POC MANY (*)    All other components within normal limits  URINALYSIS, ROUTINE W REFLEX MICROSCOPIC - Abnormal; Notable for the following:    APPearance CLOUDY (*)    Specific Gravity, Urine 1.031 (*)    Hgb urine dipstick LARGE (*)    Protein, ur 100 (*)    Leukocytes, UA SMALL (*)    All other components within normal limits  URINE MICROSCOPIC-ADD ON - Abnormal; Notable for the following:    Squamous Epithelial / LPF FEW (*)    Bacteria, UA FEW (*)    All other components within normal limits  GC/CHLAMYDIA PROBE AMP  PREGNANCY, URINE    Imaging Review No results found.   EKG Interpretation None      MDM   Final diagnoses:  BV (bacterial vaginosis)  UTI (lower urinary tract infection)    Will treat pt for bv and uti. Abdomen is benign> will send home on flagyl and macrobid for the symptoms    Ellen LowerVrinda Alekhya Gravlin, NP 11/08/13 1946  Ellen Canalavid H Yao, MD 11/08/13 [phone]

## 2013-11-08 NOTE — ED Notes (Signed)
C/o urinary freq, dysuria started yesterday

## 2013-11-08 NOTE — Discharge Instructions (Signed)
Bacterial Vaginosis °Bacterial vaginosis is a vaginal infection that occurs when the normal balance of bacteria in the vagina is disrupted. It results from an overgrowth of certain bacteria. This is the most common vaginal infection in women of childbearing age. Treatment is important to prevent complications, especially in pregnant women, as it can cause a premature delivery. °CAUSES  °Bacterial vaginosis is caused by an increase in harmful bacteria that are normally present in smaller amounts in the vagina. Several different kinds of bacteria can cause bacterial vaginosis. However, the reason that the condition develops is not fully understood. °RISK FACTORS °Certain activities or behaviors can put you at an increased risk of developing bacterial vaginosis, including: °· Having a new sex partner or multiple sex partners. °· Douching. °· Using an intrauterine device (IUD) for contraception. °Women do not get bacterial vaginosis from toilet seats, bedding, swimming pools, or contact with objects around them. °SIGNS AND SYMPTOMS  °Some women with bacterial vaginosis have no signs or symptoms. Common symptoms include: °· Grey vaginal discharge. °· A fishlike odor with discharge, especially after sexual intercourse. °· Itching or burning of the vagina and vulva. °· Burning or pain with urination. °DIAGNOSIS  °Your health care provider will take a medical history and examine the vagina for signs of bacterial vaginosis. A sample of vaginal fluid may be taken. Your health care provider will look at this sample under a microscope to check for bacteria and abnormal cells. A vaginal pH test may also be done.  °TREATMENT  °Bacterial vaginosis may be treated with antibiotic medicines. These may be given in the form of a pill or a vaginal cream. A second round of antibiotics may be prescribed if the condition comes back after treatment.  °HOME CARE INSTRUCTIONS  °· Only take over-the-counter or prescription medicines as  directed by your health care provider. °· If antibiotic medicine was prescribed, take it as directed. Make sure you finish it even if you start to feel better. °· Do not have sex until treatment is completed. °· Tell all sexual partners that you have a vaginal infection. They should see their health care provider and be treated if they have problems, such as a mild rash or itching. °· Practice safe sex by using condoms and only having one sex partner. °SEEK MEDICAL CARE IF:  °· Your symptoms are not improving after 3 days of treatment. °· You have increased discharge or pain. °· You have a fever. °MAKE SURE YOU:  °· Understand these instructions. °· Will watch your condition. °· Will get help right away if you are not doing well or get worse. °FOR MORE INFORMATION  °Centers for Disease Control and Prevention, Division of STD Prevention: www.cdc.gov/std °American Sexual Health Association (ASHA): www.ashastd.org  °Document Released: 12/22/2004 Document Revised: 10/12/2012 Document Reviewed: 08/03/2012 °ExitCare® Patient Information ©2015 [employer]. This information is not intended to replace advice given to you by your health care provider. Make sure you discuss any questions you have with your health care provider. ° °Urinary Tract Infection °A urinary tract infection (UTI) can occur any place along the urinary tract. The tract includes the kidneys, ureters, bladder, and urethra. A type of germ called bacteria often causes a UTI. UTIs are often helped with antibiotic medicine.  °HOME CARE  °· If given, take antibiotics as told by your doctor. Finish them even if you start to feel better. °· Drink enough fluids to keep your pee (urine) clear or pale yellow. °· Avoid tea, drinks with caffeine,   and bubbly (carbonated) drinks. °· Pee often. Avoid holding your pee in for a long time. °· Pee before and after having sex (intercourse). °· Wipe from front to back after you poop (bowel movement) if you are a woman. Use  each tissue only once. °GET HELP RIGHT AWAY IF:  °· You have back pain. °· You have lower belly (abdominal) pain. °· You have chills. °· You feel sick to your stomach (nauseous). °· You throw up (vomit). °· Your burning or discomfort with peeing does not go away. °· You have a fever. °· Your symptoms are not better in 3 days. °MAKE SURE YOU:  °· Understand these instructions. °· Will watch your condition. °· Will get help right away if you are not doing well or get worse. °Document Released: 06/10/2007 Document Revised: 09/16/2011 Document Reviewed: 07/23/2011 °ExitCare® Patient Information ©2015 [employer]. This information is not intended to replace advice given to you by your health care provider. Make sure you discuss any questions you have with your health care provider. ° °

## 2013-11-09 LAB — GC/CHLAMYDIA PROBE AMP
CT Probe RNA: NEGATIVE
GC Probe RNA: NEGATIVE

## 2018-01-25 ENCOUNTER — Other Ambulatory Visit: Payer: Self-pay

## 2018-01-25 ENCOUNTER — Encounter: Payer: Self-pay | Admitting: *Deleted

## 2018-01-25 ENCOUNTER — Emergency Department
Admission: EM | Admit: 2018-01-25 | Discharge: 2018-01-25 | Disposition: A | Discharge status: Home / Self Care | Payer: BLUE CROSS/BLUE SHIELD | Source: Home / Self Care | Attending: Family Medicine | Admitting: Family Medicine

## 2018-01-25 DIAGNOSIS — R059 Cough, unspecified: Secondary | ICD-10-CM

## 2018-01-25 DIAGNOSIS — R05 Cough: Secondary | ICD-10-CM

## 2018-01-25 DIAGNOSIS — K029 Dental caries, unspecified: Secondary | ICD-10-CM

## 2018-01-25 DIAGNOSIS — J0100 Acute maxillary sinusitis, unspecified: Secondary | ICD-10-CM | POA: Diagnosis not present

## 2018-01-25 DIAGNOSIS — R0789 Other chest pain: Secondary | ICD-10-CM

## 2018-01-25 MED ORDER — IPRATROPIUM BROMIDE 0.06 % NA SOLN: 2.0000 | Freq: Four times a day (QID) | NASAL | 1 refills | Status: AC

## 2018-01-25 MED ORDER — CLINDAMYCIN HCL 300 MG PO CAPS: 300.0000 mg | ORAL_CAPSULE | Freq: Four times a day (QID) | ORAL | 0 refills | Status: AC

## 2018-01-25 NOTE — Discharge Instructions (Addendum)
°  Please take antibiotics as prescribed and be sure to complete entire course even if you start to feel better to ensure infection does not come back.  You may take 500mg  acetaminophen every 4-6 hours or in combination with ibuprofen 400-600mg  every 6-8 hours as needed for pain, inflammation, and fever.  Be sure to well hydrated with clear liquids and get at least 8 hours of sleep at night, preferably more while sick.   Please follow up with family medicine in 1 week if needed. Please call to schedule an appointment with a dentist for continued care of your broken tooth.

## 2018-01-25 NOTE — ED Provider Notes (Signed)
[facility]    CSN: [id number] Arrival date & time: 01/25/18  0806     History   Chief Complaint Chief Complaint  Patient presents with  . Headache  . Facial Pain  . Chest Pain    HPI Ellen Hunter is a 38 y.o. female.   HPI Ellen Hunter is a 38 y.o. female presenting to UC with c/o frontal headache for about 2 days with associated nasal congestion and sinus pressure.  She believes headache is due to a tooth that broke 2-3 weeks ago after getting popcorn stuck in her tooth.  Now, when cold air hits where her tooth broke, she has severe pain.  She has taken Tylenol and Ibuprofen w/o relief. She also note she had a mildly productive cough for 2-3 months, which has since resolved but she had an episode of chest pain last week, she believes from the cough or stress at work. Pt denies chest pain or cough today. Denies fever, chills, n/v/d. She has a PCP but never went for her cough because she never developed a fever. She does not have a Education officer, communitydentist.     Past Medical History:  Diagnosis Date  . Eczema   . Miscarriage   . Obese   . Urinary tract infection, site not specified     There are no active problems to display for this patient.   Past Surgical History:  Procedure Laterality Date  . CESAREAN SECTION     x 3  . DILATION AND EVACUATION  10/07/2011   Procedure: DILATATION AND EVACUATION;  Surgeon: Adam PhenixJames G Arnold, MD;  Location: [facility];  Service: Gynecology;  Laterality: N/A;  . TUBAL LIGATION      OB History    Gravida  7   Para  3   Term  3   Preterm      AB  3   Living  3     SAB  2   TAB  1   Ectopic      Multiple      Live Births               Home Medications    Prior to Admission medications   Medication Sig Start Date End Date Taking? Authorizing Provider  clindamycin (CLEOCIN) 300 MG capsule Take 1 capsule (300 mg total) by mouth 4 (four) times daily. X 7 days 01/25/18   Lurene ShadowPhelps, Deeana [place] O, PA-C  ipratropium (ATROVENT) 0.06  % nasal spray Place 2 sprays into both nostrils 4 (four) times daily. 01/25/18   Lurene ShadowPhelps, Kristel Durkee O, PA-C    Family History Family History  Problem Relation Age of Onset  . Hypertension Mother   . Diabetes Mother     Social History Social History   Tobacco Use  . Smoking status: Never Smoker  . Smokeless tobacco: Never Used  Substance Use Topics  . Alcohol use: No  . Drug use: No     Allergies   Amoxicillin; Blueberry flavor; and Penicillins   Review of Systems Review of Systems  Constitutional: Negative for chills and fever.  HENT: Positive for congestion, dental problem, sinus pressure and sinus pain. Negative for ear pain, sore throat, trouble swallowing and voice change.   Respiratory: Positive for cough. Negative for shortness of breath.   Cardiovascular: Positive for chest pain. Negative for palpitations.  Gastrointestinal: Negative for abdominal pain, diarrhea, nausea and vomiting.  Musculoskeletal: Negative for arthralgias, back pain and myalgias.  Skin: Negative for rash.  Neurological: Positive for headaches. Negative for dizziness and light-headedness.     Physical Exam Triage Vital Signs ED Triage Vitals [01/25/18 0841]  Enc Vitals Group     BP (!) 158/99     Pulse Rate 87     Resp 16     Temp      Temp src      SpO2 97 %     Weight (!) 301 lb (136.5 kg)     Height 5\' 7"  (1.702 m)     Head Circumference      Peak Flow      Pain Score 7     Pain Loc      Pain Edu?      Excl. in GC?    No data found.  Updated Vital Signs BP (!) 158/99 (BP Location: Right Arm)   Pulse 87   Resp 16   Ht 5\' 7"  (1.702 m)   Wt (!) 301 lb (136.5 kg)   SpO2 97%   BMI 47.14 kg/m   Visual Acuity Right Eye Distance:   Left Eye Distance:   Bilateral Distance:    Right Eye Near:   Left Eye Near:    Bilateral Near:     Physical Exam Vitals signs and nursing note reviewed.  Constitutional:      Appearance: She is well-developed.  HENT:     Head: Normocephalic  and atraumatic.     Right Ear: Tympanic membrane normal.     Left Ear: Tympanic membrane normal.     Nose:     Right Sinus: Maxillary sinus tenderness present. No frontal sinus tenderness.     Left Sinus: Maxillary sinus tenderness present. No frontal sinus tenderness.     Mouth/Throat:     Lips: Pink.     Mouth: Mucous membranes are moist.     Dentition: Abnormal dentition. Dental tenderness and dental caries present. No gum lesions.     Pharynx: Oropharynx is clear. Uvula midline.   Neck:     Musculoskeletal: Normal range of motion.  Cardiovascular:     Rate and Rhythm: Normal rate and regular rhythm.  Pulmonary:     Effort: Pulmonary effort is normal. No respiratory distress.     Breath sounds: Normal breath sounds. No stridor. No wheezing or rhonchi.  Musculoskeletal: Normal range of motion.  Skin:    General: Skin is warm and dry.  Neurological:     Mental Status: She is alert and oriented to person, place, and time.  Psychiatric:        Behavior: Behavior normal.      UC Treatments / Results  Labs (all labs ordered are listed, but only abnormal results are displayed) Labs Reviewed - No data to display  EKG None  Radiology No results found.  Procedures Procedures (including critical care time)  Medications Ordered in UC Medications - No data to display  Initial Impression / Assessment and Plan / UC Course  I have reviewed the triage vital signs and the nursing notes.  Pertinent labs & imaging results that were available during my care of the patient were reviewed by me and considered in my medical decision making (see chart for details).     HA c/w sinus infection vs deep dental infection. Will tx with clindamycin Cough and chest pain appear to have resolved since last week, however, clindamycin will also cover any potential underlying bacterial URI CP not c/w ACS.  Encouraged f/u with PCP and Dentist as needed.  Final Clinical Impressions(s) / UC  Diagnoses   Final diagnoses:  Acute non-recurrent maxillary sinusitis  Dental decay  Other chest pain  Cough     Discharge Instructions      Please take antibiotics as prescribed and be sure to complete entire course even if you start to feel better to ensure infection does not come back.  You may take 500mg  acetaminophen every 4-6 hours or in combination with ibuprofen 400-600mg  every 6-8 hours as needed for pain, inflammation, and fever.  Be sure to well hydrated with clear liquids and get at least 8 hours of sleep at night, preferably more while sick.   Please follow up with family medicine in 1 week if needed. Please call to schedule an appointment with a dentist for continued care of your broken tooth.     ED Prescriptions    Medication Sig Dispense Auth. Provider   clindamycin (CLEOCIN) 300 MG capsule Take 1 capsule (300 mg total) by mouth 4 (four) times daily. X 7 days 28 capsule Doroteo Glassman, Jasamine Pottinger O, PA-C   ipratropium (ATROVENT) 0.06 % nasal spray Place 2 sprays into both nostrils 4 (four) times daily. 15 mL Lurene Shadow, PA-C     Controlled Substance Prescriptions [place] Controlled Substance Registry consulted? Not Applicable   Rolla Plate 01/25/18 0947

## 2018-01-25 NOTE — ED Triage Notes (Signed)
Patient reports chipping a tooth while eating popcorn. Shortly after, the cold air hit her tooth causing sever pain and ever since c/o facial pain and HA. Taking tylenol and IBF. Also reports periods of CP 1 week ago. Reports recent increase in stress but also had a cough for 2 moths that resolved about 2 weeks ago.

## 2019-12-30 ENCOUNTER — Emergency Department (HOSPITAL_BASED_OUTPATIENT_CLINIC_OR_DEPARTMENT_OTHER)
Admission: EM | Admit: 2019-12-30 | Discharge: 2019-12-30 | Disposition: A | Discharge status: Home / Self Care | Payer: Self-pay | Attending: Emergency Medicine | Admitting: Emergency Medicine

## 2019-12-30 ENCOUNTER — Other Ambulatory Visit: Payer: Self-pay

## 2019-12-30 ENCOUNTER — Encounter (HOSPITAL_BASED_OUTPATIENT_CLINIC_OR_DEPARTMENT_OTHER): Payer: Self-pay | Admitting: *Deleted

## 2019-12-30 DIAGNOSIS — N898 Other specified noninflammatory disorders of vagina: Secondary | ICD-10-CM | POA: Insufficient documentation

## 2019-12-30 DIAGNOSIS — R300 Dysuria: Secondary | ICD-10-CM | POA: Insufficient documentation

## 2019-12-30 DIAGNOSIS — B9689 Other specified bacterial agents as the cause of diseases classified elsewhere: Secondary | ICD-10-CM

## 2019-12-30 DIAGNOSIS — R1030 Lower abdominal pain, unspecified: Secondary | ICD-10-CM | POA: Insufficient documentation

## 2019-12-30 LAB — WET PREP, GENITAL
Sperm: NONE SEEN
Trich, Wet Prep: NONE SEEN
Yeast Wet Prep HPF POC: NONE SEEN

## 2019-12-30 LAB — URINALYSIS, ROUTINE W REFLEX MICROSCOPIC
Bilirubin Urine: NEGATIVE
Glucose, UA: NEGATIVE mg/dL
Hgb urine dipstick: NEGATIVE
Ketones, ur: NEGATIVE mg/dL
Leukocytes,Ua: NEGATIVE
Nitrite: NEGATIVE
Protein, ur: NEGATIVE mg/dL
Specific Gravity, Urine: 1.03 (ref 1.005–1.030)
pH: 6 (ref 5.0–8.0)

## 2019-12-30 LAB — PREGNANCY, URINE: Preg Test, Ur: NEGATIVE

## 2019-12-30 MED ORDER — METRONIDAZOLE 0.75 % EX CREA: TOPICAL_CREAM | Freq: Two times a day (BID) | CUTANEOUS | 0 refills | Status: AC

## 2019-12-30 NOTE — ED Notes (Signed)
Lab made aware of order to add on urine pregnancy.

## 2019-12-30 NOTE — ED Triage Notes (Signed)
Abdominal pain and feeling that my bladder is full x 2 days.  Took AZO for 2 days

## 2019-12-30 NOTE — ED Notes (Signed)
Vaginal Swabs obtained, pt self swabbed, tubes to the labs

## 2019-12-30 NOTE — ED Provider Notes (Signed)
[facility] EMERGENCY DEPARTMENT Provider Note   CSN: [id number] Arrival date & time: 12/30/19  1410     History Chief Complaint  Patient presents with  . Abdominal Pain  . Urinary Tract Infection    Ellen Hunter is a 39 y.o. female hx of UTI, here presenting with dysuria and discharge. Patient states that she has some bladder fullness. Patient states that she has been taking some Azo with no relief.  She has some vaginal discharge as well.  She is sexually active with one female partner only and he has no symptoms.  Patient states that she has history of UTIs in the past and she was concerned that she may have a UTI. She is not concerned for STDs   The history is provided by the patient.       Past Medical History:  Diagnosis Date  . Eczema   . Miscarriage   . Obese   . Urinary tract infection, site not specified     There are no problems to display for this patient.   Past Surgical History:  Procedure Laterality Date  . CESAREAN SECTION     x 3  . DILATION AND EVACUATION  10/07/2011   Procedure: DILATATION AND EVACUATION;  Surgeon: Adam Phenix, MD;  Location: [facility];  Service: Gynecology;  Laterality: N/A;  . TUBAL LIGATION       OB History    Gravida  7   Para  3   Term  3   Preterm      AB  3   Living  3     SAB  2   IAB  1   Ectopic      Multiple      Live Births              Family History  Problem Relation Age of Onset  . Hypertension Mother   . Diabetes Mother     Social History   Tobacco Use  . Smoking status: Never Smoker  . Smokeless tobacco: Never Used  Substance Use Topics  . Alcohol use: No  . Drug use: No    Home Medications Prior to Admission medications   Medication Sig Start Date End Date Taking? Authorizing Provider  clindamycin (CLEOCIN) 300 MG capsule Take 1 capsule (300 mg total) by mouth 4 (four) times daily. X 7 days 01/25/18   Lurene Shadow, PA-C  ipratropium (ATROVENT) 0.06 % nasal spray  Place 2 sprays into both nostrils 4 (four) times daily. 01/25/18   Lurene Shadow, PA-C    Allergies    Amoxicillin, Blueberry flavor, and Penicillins  Review of Systems   Review of Systems  Genitourinary: Positive for dysuria.  All other systems reviewed and are negative.   Physical Exam Updated Vital Signs BP (!) 151/100 (BP Location: Right Arm)   Pulse 80   Temp 98.2 F (36.8 C) (Oral)   Resp 16   Ht 5\' 7"  (1.702 m)   Wt 113.4 kg   LMP 12/21/2019   SpO2 100%   BMI 39.14 kg/m   Physical Exam Vitals and nursing note reviewed.  Constitutional:      Appearance: She is well-developed.  HENT:     Head: Normocephalic.     Mouth/Throat:     Mouth: Mucous membranes are moist.  Eyes:     Extraocular Movements: Extraocular movements intact.  Cardiovascular:     Rate and Rhythm: Normal rate and regular rhythm.  Heart sounds: Normal heart sounds.  Pulmonary:     Effort: Pulmonary effort is normal.     Breath sounds: Normal breath sounds.  Abdominal:     General: Abdomen is flat.     Comments: Mild suprapubic tenderness   Skin:    General: Skin is warm.     Capillary Refill: Capillary refill takes less than 2 seconds.  Neurological:     General: No focal deficit present.     Mental Status: She is alert.  Psychiatric:        Mood and Affect: Mood normal.     ED Results / Procedures / Treatments   Labs (all labs ordered are listed, but only abnormal results are displayed) Labs Reviewed  WET PREP, GENITAL - Abnormal; Notable for the following components:      Result Value   Clue Cells Wet Prep HPF POC PRESENT (*)    WBC, Wet Prep HPF POC FEW (*)    All other components within normal limits  URINALYSIS, ROUTINE W REFLEX MICROSCOPIC  PREGNANCY, URINE  GC/CHLAMYDIA PROBE AMP ([place]) NOT AT [facility]    EKG None  Radiology No results found.  Procedures Procedures (including critical care time)  Medications Ordered in ED Medications - No data to  display  ED Course  I have reviewed the triage vital signs and the nursing notes.  Pertinent labs & imaging results that were available during my care of the patient were reviewed by me and considered in my medical decision making (see chart for details).    MDM Rules/Calculators/A&P                         Ellen Hunter is a 39 y.o. female here with suprapubic pain.  Consider UTI versus BV.  Patient has no pelvic tenderness.  Patient is sexually active with one female partner.  Will test for UA and UCG.  Patient will self swab.   5:03 PM UA is normal and UCG is negative.  Wet prep showed clue cells.  I think likely bacterial vaginosis.  Patient wants to hold off on empiric treatment for GC chlamydia for now.  She prefers MetroGel.  Final Clinical Impression(s) / ED Diagnoses Final diagnoses:  None    Rx / DC Orders ED Discharge Orders    None       Charlynne Pander, MD 12/30/19 1704

## 2019-12-30 NOTE — Discharge Instructions (Signed)
Use metrogel twice daily for a week   See your doctor   You will be called if you have positive gonorrhea or chlamydia   Return to ER if you have worse pelvic pain, vomiting, fever

## 2020-01-01 LAB — GC/CHLAMYDIA PROBE AMP (~~LOC~~) NOT AT ARMC
Chlamydia: NEGATIVE
Comment: NEGATIVE
Comment: NORMAL
Neisseria Gonorrhea: NEGATIVE

## 2021-01-24 ENCOUNTER — Emergency Department (HOSPITAL_BASED_OUTPATIENT_CLINIC_OR_DEPARTMENT_OTHER)
Admission: EM | Admit: 2021-01-24 | Discharge: 2021-01-24 | Disposition: A | Discharge status: Home / Self Care | Payer: BLUE CROSS/BLUE SHIELD | Attending: Emergency Medicine | Admitting: Emergency Medicine

## 2021-01-24 ENCOUNTER — Encounter (HOSPITAL_BASED_OUTPATIENT_CLINIC_OR_DEPARTMENT_OTHER): Payer: Self-pay | Admitting: *Deleted

## 2021-01-24 ENCOUNTER — Emergency Department (HOSPITAL_BASED_OUTPATIENT_CLINIC_OR_DEPARTMENT_OTHER): Payer: BLUE CROSS/BLUE SHIELD

## 2021-01-24 ENCOUNTER — Other Ambulatory Visit: Payer: Self-pay

## 2021-01-24 DIAGNOSIS — N644 Mastodynia: Secondary | ICD-10-CM | POA: Insufficient documentation

## 2021-01-24 DIAGNOSIS — R112 Nausea with vomiting, unspecified: Secondary | ICD-10-CM | POA: Insufficient documentation

## 2021-01-24 DIAGNOSIS — R1013 Epigastric pain: Secondary | ICD-10-CM | POA: Insufficient documentation

## 2021-01-24 DIAGNOSIS — R110 Nausea: Secondary | ICD-10-CM

## 2021-01-24 LAB — COMPREHENSIVE METABOLIC PANEL
ALT: 13 U/L (ref 0–44)
AST: 14 U/L — ABNORMAL LOW (ref 15–41)
Albumin: 3.6 g/dL (ref 3.5–5.0)
Alkaline Phosphatase: 28 U/L — ABNORMAL LOW (ref 38–126)
Anion gap: 6 (ref 5–15)
BUN: 10 mg/dL (ref 6–20)
CO2: 23 mmol/L (ref 22–32)
Calcium: 8.8 mg/dL — ABNORMAL LOW (ref 8.9–10.3)
Chloride: 107 mmol/L (ref 98–111)
Creatinine, Ser: 0.65 mg/dL (ref 0.44–1.00)
GFR, Estimated: >60 mL/min (ref >60)
Glucose, Bld: 91 mg/dL (ref 70–99)
Potassium: 3.9 mmol/L (ref 3.5–5.1)
Sodium: 136 mmol/L (ref 135–145)
Total Bilirubin: 0.5 mg/dL (ref 0.3–1.2)
Total Protein: 7.3 g/dL (ref 6.5–8.1)

## 2021-01-24 LAB — CBC WITH DIFFERENTIAL/PLATELET
Abs Immature Granulocytes: 0.01 10*3/uL (ref 0.00–0.07)
Basophils Absolute: 0.1 10*3/uL (ref 0.0–0.1)
Basophils Relative: 1 %
Eosinophils Absolute: 0.1 10*3/uL (ref 0.0–0.5)
Eosinophils Relative: 1 %
HCT: 32 % — ABNORMAL LOW (ref 36.0–46.0)
Hemoglobin: 9.6 g/dL — ABNORMAL LOW (ref 12.0–15.0)
Immature Granulocytes: 0 %
Lymphocytes Relative: 28 %
Lymphs Abs: 2.2 10*3/uL (ref 0.7–4.0)
MCH: 22.4 pg — ABNORMAL LOW (ref 26.0–34.0)
MCHC: 30 g/dL (ref 30.0–36.0)
MCV: 74.6 fL — ABNORMAL LOW (ref 80.0–100.0)
Monocytes Absolute: 0.9 10*3/uL (ref 0.1–1.0)
Monocytes Relative: 11 %
Neutro Abs: 4.7 10*3/uL (ref 1.7–7.7)
Neutrophils Relative %: 59 %
Platelets: 267 10*3/uL (ref 150–400)
RBC: 4.29 MIL/uL (ref 3.87–5.11)
RDW: 17.9 % — ABNORMAL HIGH (ref 11.5–15.5)
WBC: 7.9 10*3/uL (ref 4.0–10.5)
nRBC: 0 % (ref 0.0–0.2)

## 2021-01-24 LAB — URINALYSIS, ROUTINE W REFLEX MICROSCOPIC
Bilirubin Urine: NEGATIVE
Glucose, UA: NEGATIVE mg/dL
Hgb urine dipstick: NEGATIVE
Ketones, ur: NEGATIVE mg/dL
Leukocytes,Ua: NEGATIVE
Nitrite: NEGATIVE
Protein, ur: NEGATIVE mg/dL
Specific Gravity, Urine: >=1.03 (ref 1.005–1.030)
pH: 5.5 (ref 5.0–8.0)

## 2021-01-24 LAB — LIPASE, BLOOD: Lipase: 28 U/L (ref 11–51)

## 2021-01-24 LAB — PREGNANCY, URINE: Preg Test, Ur: NEGATIVE

## 2021-01-24 MED ORDER — ONDANSETRON 4 MG PO TBDP
4.0000 mg | ORAL_TABLET | Freq: Once | ORAL | Status: AC
  Administered 2021-01-24: 4 mg via ORAL
  Filled 2021-01-24: qty 1

## 2021-01-24 MED ORDER — TRIAMCINOLONE ACETONIDE 0.1 % EX CREA: 1.0000 "application " | TOPICAL_CREAM | Freq: Two times a day (BID) | CUTANEOUS | 0 refills | Status: AC

## 2021-01-24 MED ORDER — ONDANSETRON HCL 4 MG PO TABS: 4.0000 mg | ORAL_TABLET | Freq: Four times a day (QID) | ORAL | 0 refills | Status: AC

## 2021-01-24 MED ORDER — OMEPRAZOLE 20 MG PO CPDR: 20.0000 mg | DELAYED_RELEASE_CAPSULE | Freq: Every day | ORAL | 0 refills | Status: AC

## 2021-01-24 MED ORDER — ONDANSETRON HCL 4 MG/2ML IJ SOLN
4.0000 mg | Freq: Once | INTRAMUSCULAR | Status: AC
  Administered 2021-01-24: 4 mg via INTRAVENOUS
  Filled 2021-01-24: qty 2

## 2021-01-24 MED ORDER — IOHEXOL 300 MG/ML  SOLN
100.0000 mL | Freq: Once | INTRAMUSCULAR | Status: AC | PRN
  Administered 2021-01-24: 100 mL via INTRAVENOUS

## 2021-01-24 NOTE — ED Triage Notes (Signed)
Nausea and breast soreness for a 6 weeks. She was seen by her MD and had a negative pregnancy test. She was told she is constipation.

## 2021-01-24 NOTE — ED Provider Notes (Signed)
[facility] EMERGENCY DEPARTMENT Provider Note   CSN: [id number] Arrival date & time: 01/24/21  1436     History  Chief Complaint  Patient presents with   Nausea   Breast Problem    Ellen Hunter is a 41 y.o. female presenting today with a complaint of nausea and vomiting breast tenderness for the past 1 to 2 weeks.  Patient reports she was seen by primary care with concern for pregnancy and test was negative.  Also had a visit with urgent care where test was negative.  She continued to complain of nausea, vomiting and abdominal distention so she was told that she likely needs a CT scan.  The next outpatient appointment was next week and she said she could not wait until then and came to the emergency department.  Denies diarrhea or constipation.  Last menstrual period was New Year's Day, lasted 7 days, had 1 day without bleeding and then began to bleed again for 1 to 2 days.  Reports she has had 4 pregnancies and this feels similar.  Odors are triggers of her nausea and vomiting similar to when she was pregnant.  No history of PUD or GERD.   Home Medications Prior to Admission medications   Medication Sig Start Date End Date Taking? Authorizing Provider  clindamycin (CLEOCIN) 300 MG capsule Take 1 capsule (300 mg total) by mouth 4 (four) times daily. X 7 days 01/25/18   Noe Gens, PA-C  ipratropium (ATROVENT) 0.06 % nasal spray Place 2 sprays into both nostrils 4 (four) times daily. 01/25/18   Noe Gens, PA-C  metroNIDAZOLE (METROCREAM) 0.75 % cream Apply topically 2 (two) times daily. 12/30/19   Drenda Freeze, MD      Allergies    Amoxicillin, Blueberry flavor, and Penicillins    Review of Systems   Review of Systems  Constitutional:  Negative for chills and fever.  Gastrointestinal:  Positive for nausea and vomiting. Negative for abdominal pain, constipation and diarrhea.  Genitourinary:  Negative for dysuria and hematuria.   Physical Exam Updated  Vital Signs BP 106/66 (BP Location: Right Arm)    Pulse 75    Temp 98.3 F (36.8 C) (Oral)    Resp 16    Ht 5\' 7"  (1.702 m)    Wt 122.5 kg    SpO2 100%    BMI 42.29 kg/m  Physical Exam Vitals and nursing note reviewed.  Constitutional:      General: She is not in acute distress.    Appearance: Normal appearance. She is not ill-appearing.  HENT:     Head: Normocephalic and atraumatic.  Eyes:     General: No scleral icterus.    Conjunctiva/sclera: Conjunctivae normal.  Pulmonary:     Effort: Pulmonary effort is normal. No respiratory distress.  Abdominal:     General: Abdomen is flat. There is no distension.     Palpations: Abdomen is soft. There is no mass.     Tenderness: There is abdominal tenderness (Epigastric).     Hernia: No hernia is present.     Comments: Patch of eczema just superior to the umbilicus  Skin:    Findings: No rash.  Neurological:     Mental Status: She is alert.  Psychiatric:        Mood and Affect: Mood normal.    ED Results / Procedures / Treatments   Labs (all labs ordered are listed, but only abnormal results are displayed) Labs Reviewed  URINALYSIS, ROUTINE  W REFLEX MICROSCOPIC  PREGNANCY, URINE    EKG None  Radiology CT ABDOMEN PELVIS W CONTRAST  Result Date: 01/24/2021 CLINICAL DATA:  Acute abdominal pain. EXAM: CT ABDOMEN AND PELVIS WITH CONTRAST TECHNIQUE: Multidetector CT imaging of the abdomen and pelvis was performed using the standard protocol following bolus administration of intravenous contrast. RADIATION DOSE REDUCTION: This exam was performed according to the departmental dose-optimization program which includes automated exposure control, adjustment of the mA and/or kV according to patient size and/or use of iterative reconstruction technique. CONTRAST:  147mL OMNIPAQUE IOHEXOL 300 MG/ML  SOLN COMPARISON:  None. FINDINGS: Lower chest: No acute abnormality. Hepatobiliary: Multiple gallstones are identified measuring up to 4 cm. There  is no pericholecystic inflammation. There is no biliary ductal dilatation. The liver is within normal limits. Pancreas: Unremarkable. No pancreatic ductal dilatation or surrounding inflammatory changes. Spleen: Normal in size without focal abnormality. Adrenals/Urinary Tract: Adrenal glands are unremarkable. Kidneys are normal, without renal calculi, focal lesion, or hydronephrosis. Bladder is unremarkable. Stomach/Bowel: Stomach is within normal limits. Appendix appears normal. No evidence of bowel wall thickening, distention, or inflammatory changes. Vascular/Lymphatic: No significant vascular findings are present. No enlarged abdominal or pelvic lymph nodes. Reproductive: Uterus and bilateral adnexa are unremarkable. Other: No abdominal wall hernia or abnormality. No abdominopelvic ascites. Musculoskeletal: No fracture is seen. IMPRESSION: 1. Cholelithiasis. 2. No acute localizing process in the abdomen or pelvis. Electronically Signed   By: Ronney Asters M.D.   On: 01/24/2021 18:08    Procedures Procedures    Medications Ordered in ED Medications - No data to display  ED Course/ Medical Decision Making/ A&P                           Medical Decision Making Amount and/or Complexity of Data Reviewed Labs: ordered. Radiology: ordered.  Risk Prescription drug management.   41 year old female presenting today with abdominal discomfort and nausea.  Reports that she has been bloated, nauseated and feels like she did when she was pregnant previously.  Differential includes gastritis, PUD, cholecystitis, appendicitis, pancreatitis, gastroenteritis, ACS, COVID and flu.  Patient's vital signs stable, low concern for acute infection.   Testing: No remarkable findings and blood work.  CT scan ordered and individually interpreted by myself, I agree with the reading of cholelithiasis without cholecystitis.  No obvious causes of her symptoms.  Disposition: I believe patient needs to see a  gastroenterologist to further assess her symptoms if they continue past the next few days.  She may need further imaging or recurrent treatment of her GI symptoms.  She is agreeable to this plan.  I will discharge her home with Zofran and omeprazole   Patient ambulated out of the department without difficulty.   Final Clinical Impression(s) / ED Diagnoses Final diagnoses:  Nausea  Breast tenderness in female    Rx / DC Orders Results and diagnoses were explained to the patient. Return precautions discussed in full. Patient had no additional questions and expressed complete understanding.   This chart was dictated using voice recognition software.  Despite best efforts to proofread,  errors can occur which can change the documentation meaning.      Darliss Ridgel 01/24/21 1835    Lucrezia Starch, MD 01/27/21 [phone]

## 2021-07-25 ENCOUNTER — Emergency Department (HOSPITAL_BASED_OUTPATIENT_CLINIC_OR_DEPARTMENT_OTHER): Payer: BLUE CROSS/BLUE SHIELD | Admitting: Radiology

## 2021-07-25 ENCOUNTER — Encounter (HOSPITAL_BASED_OUTPATIENT_CLINIC_OR_DEPARTMENT_OTHER): Payer: Self-pay

## 2021-07-25 ENCOUNTER — Emergency Department (HOSPITAL_BASED_OUTPATIENT_CLINIC_OR_DEPARTMENT_OTHER)
Admission: EM | Admit: 2021-07-25 | Discharge: 2021-07-25 | Disposition: A | Discharge status: Home / Self Care | Payer: BLUE CROSS/BLUE SHIELD | Attending: Emergency Medicine | Admitting: Emergency Medicine

## 2021-07-25 ENCOUNTER — Emergency Department (HOSPITAL_BASED_OUTPATIENT_CLINIC_OR_DEPARTMENT_OTHER): Payer: BLUE CROSS/BLUE SHIELD

## 2021-07-25 ENCOUNTER — Other Ambulatory Visit: Payer: Self-pay

## 2021-07-25 DIAGNOSIS — J45909 Unspecified asthma, uncomplicated: Secondary | ICD-10-CM | POA: Diagnosis not present

## 2021-07-25 DIAGNOSIS — R5383 Other fatigue: Secondary | ICD-10-CM | POA: Diagnosis present

## 2021-07-25 DIAGNOSIS — R0602 Shortness of breath: Secondary | ICD-10-CM | POA: Insufficient documentation

## 2021-07-25 DIAGNOSIS — D649 Anemia, unspecified: Secondary | ICD-10-CM | POA: Diagnosis not present

## 2021-07-25 LAB — BASIC METABOLIC PANEL
Anion gap: 10 (ref 5–15)
BUN: 9 mg/dL (ref 6–20)
CO2: 24 mmol/L (ref 22–32)
Calcium: 9.2 mg/dL (ref 8.9–10.3)
Chloride: 105 mmol/L (ref 98–111)
Creatinine, Ser: 0.77 mg/dL (ref 0.44–1.00)
GFR, Estimated: >60 mL/min (ref >60)
Glucose, Bld: 157 mg/dL — ABNORMAL HIGH (ref 70–99)
Potassium: 3.7 mmol/L (ref 3.5–5.1)
Sodium: 139 mmol/L (ref 135–145)

## 2021-07-25 LAB — PREGNANCY, URINE: Preg Test, Ur: NEGATIVE

## 2021-07-25 LAB — CBC
HCT: 32.1 % — ABNORMAL LOW (ref 36.0–46.0)
Hemoglobin: 9.6 g/dL — ABNORMAL LOW (ref 12.0–15.0)
MCH: 23.9 pg — ABNORMAL LOW (ref 26.0–34.0)
MCHC: 29.9 g/dL — ABNORMAL LOW (ref 30.0–36.0)
MCV: 79.9 fL — ABNORMAL LOW (ref 80.0–100.0)
Platelets: 274 10*3/uL (ref 150–400)
RBC: 4.02 MIL/uL (ref 3.87–5.11)
RDW: 17.1 % — ABNORMAL HIGH (ref 11.5–15.5)
WBC: 7.8 10*3/uL (ref 4.0–10.5)
nRBC: 0 % (ref 0.0–0.2)

## 2021-07-25 LAB — TROPONIN I (HIGH SENSITIVITY): Troponin I (High Sensitivity): <2 ng/L (ref <18)

## 2021-07-25 MED ORDER — IOHEXOL 350 MG/ML SOLN
100.0000 mL | Freq: Once | INTRAVENOUS | Status: AC | PRN
  Administered 2021-07-25: 80 mL via INTRAVENOUS

## 2021-07-25 MED ORDER — IOHEXOL 350 MG/ML SOLN: 80.0000 mL | Freq: Once | INTRAVENOUS | Status: DC | PRN

## 2021-07-25 MED ORDER — DIPHENHYDRAMINE HCL 50 MG/ML IJ SOLN
12.5000 mg | Freq: Once | INTRAMUSCULAR | Status: AC
  Administered 2021-07-25: 12.5 mg via INTRAVENOUS
  Filled 2021-07-25: qty 1

## 2021-07-25 MED ORDER — METHYLPREDNISOLONE SODIUM SUCC 125 MG IJ SOLR
125.0000 mg | Freq: Once | INTRAMUSCULAR | Status: AC
  Administered 2021-07-25: 125 mg via INTRAVENOUS
  Filled 2021-07-25: qty 2

## 2021-07-25 MED ORDER — ALBUTEROL SULFATE (2.5 MG/3ML) 0.083% IN NEBU
INHALATION_SOLUTION | RESPIRATORY_TRACT | Status: AC
  Administered 2021-07-25: 10 mg via RESPIRATORY_TRACT
  Filled 2021-07-25: qty 12

## 2021-07-25 MED ORDER — ALBUTEROL SULFATE (2.5 MG/3ML) 0.083% IN NEBU: 10.0000 mg | INHALATION_SOLUTION | Freq: Once | RESPIRATORY_TRACT | Status: AC

## 2021-07-25 MED ORDER — MAGNESIUM SULFATE 2 GM/50ML IV SOLN
2.0000 g | Freq: Once | INTRAVENOUS | Status: AC
  Administered 2021-07-25: 2 g via INTRAVENOUS
  Filled 2021-07-25: qty 50

## 2021-07-25 MED ORDER — PREDNISONE 10 MG PO TABS: 60.0000 mg | ORAL_TABLET | Freq: Every day | ORAL | 0 refills | Status: AC

## 2021-07-25 NOTE — ED Provider Notes (Signed)
[facility] EMERGENCY DEPT Provider Note   CSN: [id number] Arrival date & time: 07/25/21  1046     History  Chief Complaint  Patient presents with   Chest Pain   Shortness of Breath    Ellen Hunter is a 41 y.o. female w/ hx of asthma, obesity, presenting to ED with generalized fatigue, chest pain and pressure, shortness of breath.  The patient reports that she had COVID about 3 weeks ago at the end of June, felt miserable with COVID, a and had a respiratory symptoms.  She was put on steroids briefly which did improve her symptoms, but subsequently her breathing and symptoms got worse afterwards.  She feels extremely fatigued.  Her husband at bedside reports she has been sleeping all day.  She is try to go back to work the past 2 days but has had no energy to do this.  She describes that she had a heavy pressure sensation in her chest today.  She was using albuterol nebulizers and inhalers at home with little relief.  She denies any other significant coronary or cardiac history HPI     Home Medications Prior to Admission medications   Medication Sig Start Date End Date Taking? Authorizing Provider  clindamycin (CLEOCIN) 300 MG capsule Take 1 capsule (300 mg total) by mouth 4 (four) times daily. X 7 days 01/25/18   Lurene Shadow, PA-C  ipratropium (ATROVENT) 0.06 % nasal spray Place 2 sprays into both nostrils 4 (four) times daily. 01/25/18   Lurene Shadow, PA-C  metroNIDAZOLE (METROCREAM) 0.75 % cream Apply topically 2 (two) times daily. 12/30/19   Charlynne Pander, MD  omeprazole (PRILOSEC) 20 MG capsule Take 1 capsule (20 mg total) by mouth daily. 01/24/21   Redwine, Madison A, PA-C  triamcinolone cream (KENALOG) 0.1 % Apply 1 application topically 2 (two) times daily. 01/24/21   Redwine, Madison A, PA-C      Allergies    Amoxicillin, Blueberry flavor, and Penicillins    Review of Systems   Review of Systems  Physical Exam Updated Vital Signs BP 118/73 (BP  Location: Right Arm)   Pulse 85   Temp 98.8 F (37.1 C)   Resp 18   SpO2 100%  Physical Exam Constitutional:      General: She is not in acute distress.    Appearance: She is obese.     Comments: Extremely fatigued, soft-spoken  HENT:     Head: Normocephalic and atraumatic.  Eyes:     Conjunctiva/sclera: Conjunctivae normal.     Pupils: Pupils are equal, round, and reactive to light.  Cardiovascular:     Rate and Rhythm: Normal rate and regular rhythm.  Pulmonary:     Effort: Pulmonary effort is normal. No respiratory distress.     Comments: No hypoxia.  Breath sounds difficult to auscultate due to poor respiratory effort.  No accessory muscle use Abdominal:     General: There is no distension.     Tenderness: There is no abdominal tenderness.  Skin:    General: Skin is warm and dry.  Neurological:     General: No focal deficit present.     Mental Status: She is alert. Mental status is at baseline.  Psychiatric:        Mood and Affect: Mood normal.        Behavior: Behavior normal.     ED Results / Procedures / Treatments   Labs (all labs ordered are listed, but only abnormal results are  displayed) Labs Reviewed  BASIC METABOLIC PANEL - Abnormal; Notable for the following components:      Result Value   Glucose, Bld 157 (*)    All other components within normal limits  CBC - Abnormal; Notable for the following components:   Hemoglobin 9.6 (*)    HCT 32.1 (*)    MCV 79.9 (*)    MCH 23.9 (*)    MCHC 29.9 (*)    RDW 17.1 (*)    All other components within normal limits  PREGNANCY, URINE  TROPONIN I (HIGH SENSITIVITY)  TROPONIN I (HIGH SENSITIVITY)    EKG None  Radiology DG Chest 2 View  Result Date: 07/25/2021 CLINICAL DATA:  Midsternal chest pain and shortness of breath starting this morning. EXAM: CHEST - 2 VIEW COMPARISON:  None Available. FINDINGS: Mild cardiomegaly. Low lung volume. Both lungs are clear. Moderate thoracic spondylosis with prominent  endplate osteophytes. IMPRESSION: Cardiac silhouette appears mildly enlarged. No active cardiopulmonary disease. Electronically Signed   By: Marjo Bicker M.D.   On: 07/25/2021 11:18    Procedures Procedures    Medications Ordered in ED Medications - No data to display  ED Course/ Medical Decision Making/ A&P Clinical Course as of 07/25/21 1524  Fri Jul 25, 2021  1446 Patient was premedicated with small mount of Benadryl she reported the last time she had an iodine contrast she had developed a "itching" directly near the IV insertion site.  No systemic symptoms at that time.  I am not certain that this is a true allergic reaction, but the patient has already received IV Solu-Medrol steroids, we can give a small amount of Benadryl as well.  I do believe that the benefits of CT imaging outweigh the risks at this time, the patient and her husband are in agreement and wish to proceed. [MT]    Clinical Course User Index [MT] Mills Mitton, Kermit Balo, MD                           Medical Decision Making Amount and/or Complexity of Data Reviewed Labs: ordered. Radiology: ordered.  Risk Prescription drug management.   This patient presents to the ED with concern for chest pain, dyspnea on exertion, in the setting of recent COVID diagnosis. This involves an extensive number of treatment options, and is a complaint that carries with it a high risk of complications and morbidity.  The differential diagnosis includes long COVID syndrome versus pneumonia versus pulmonary embolism versus acute anemia versus other  I strongly suspect this is related to long COVID, particularly with her generalized fatigue, dyspnea on exertion.  However will need to rule out pulmonary embolism.  Unfortunately, she is quite difficult auscultate, due to her generalized fatigue, very poor inspiratory effort.  She is speaking in short sentences.  Cannot exclude asthma exacerbation.  She would like it was a good effort for peak  flow.  Thankfully she does not appear tachypneic, or hypoxic.  We will try some steroids and breathing treatments and reassess.  *  Co-morbidities that complicate the patient evaluation: History of chronic iron deficiency anemia high risk for recurring anemia, history of obesity and asthma, at risk for respiratory complications due to hypoventilation and underlying lung disease, in the setting of COVID  Additional history obtained from patient's husband at bedside  I ordered and personally interpreted labs.  The pertinent results include: No leukocytosis.  Hemoglobin at baseline anemic levels 9.6, no significant or acute anemia.  BMP unremarkable.  Troponin undetectable.  With symptoms ongoing for several hours I doubt this is ACS in this clinical presentation.  Doubt myocarditis.  I ordered imaging studies including CT PE study, which was pending at the time of signout.  The patient was maintained on a cardiac monitor.  I personally viewed and interpreted the cardiac monitored which showed an underlying rhythm of: Normal sinus rhythm, no hypoxia  Per my interpretation the patient's ECG shows normal sinus rhythm with no acute ischemic findings  I ordered medication including IV steroids, asthma, magnesium for suspected asthma exacerbation.   I have reviewed the patients home medicines and have made adjustments as needed   Dispostion:  Patient was signed out to ED provider Dr Ricke Hey pending f/u on CT imaging.  If there is no acute PE or concern for pneumonia, and the patient appears improved from a respiratory standpoint, the patient can be discharged home with steroids for several days, and advised to continue using albuterol nebulizer treatments for possible asthma exacerbation.  I already explained to the patient this may also be a long COVID syndrome presentation, which unfortunately is more difficult to manage.         Final Clinical Impression(s) / ED Diagnoses Final  diagnoses:  None    Rx / DC Orders ED Discharge Orders     None         Taysia Rivere, Kermit Balo, MD 07/25/21 1525

## 2021-07-25 NOTE — Discharge Instructions (Addendum)
Please call your primary care doctor's office to schedule an appointment for a follow-up visit as soon as possible.  I would recommend that you give yourself albuterol nebulizer breathing treatments every 4 hours while awake for the next 2 days.  After that you can go back to using his medicine as needed.  I recommended that you continue taking steroids for the next several days, which can help with asthma and also your energy level.  You may be experiencing long COVID syndrome.

## 2021-07-25 NOTE — ED Provider Notes (Signed)
Care assumed from Dr. Renaye Rakers.  At time of transfer of care, patient is awaiting for results of CT PE study.  If work-up is reassuring, plan of care will be to discharge home.  5:30 PM I reassessed patient after her CT returned without evidence of pulmonary embolism.  We went through the findings.  We went through her labs.  We discussed adding on a second troponin or even a BNP given her recent COVID however she did not want to have a more prolonged work-up at this time.  She agrees to follow-up with a PCP and cardiologist and we suspect these are likely related to her recent COVID infection.  She will take the steroids and understands return precautions and follow-up instructions.  She had no other questions or concerns and was discharged in good condition.   Clinical Impression: 1. Other fatigue   2. Shortness of breath   3. Anemia, unspecified type     Disposition: Discharge  Condition: Good  I have discussed the results, Dx and Tx plan with the pt(& family if present). He/she/they expressed understanding and agree(s) with the plan. Discharge instructions discussed at great length. Strict return precautions discussed and pt &/or family have verbalized understanding of the instructions. No further questions at time of discharge.    New Prescriptions   PREDNISONE (DELTASONE) 10 MG TABLET    Take 6 tablets (60 mg total) by mouth daily with breakfast for 5 days.    Follow Up: Devra Dopp, MD [address] [place] [address] [phone]  Schedule an appointment as soon as possible for a visit in 3 days   [facility] [address] [place] [address] [phone] Go to  If symptoms worsen  [facility] CARDIOVASCULAR [facility] [address] [place] [address] [phone]       Christ Fullenwider, Canary Brim, MD 07/25/21 [phone]

## 2021-07-25 NOTE — ED Triage Notes (Signed)
Pt presents POV from home with mid-sternum chest pain and SOB starting this morning. Pt recently dx with covid 2 weeks ago.  Pt returned to work two days ago from having covid. Reports increase fatigue, BLE hurting, dry cough and SOB.  Pt reports taking Robitussin, home neb treatment with no relief.  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

## 2021-07-25 NOTE — ED Notes (Signed)
Discharge instructions, follow up care, and prescription reviewed and explained, pt verbalized understanding. Pt had no further questions on d/c. Pt caox4 on d/c.

## 2022-02-04 ENCOUNTER — Other Ambulatory Visit: Payer: Self-pay

## 2022-02-04 ENCOUNTER — Encounter (HOSPITAL_BASED_OUTPATIENT_CLINIC_OR_DEPARTMENT_OTHER): Payer: Self-pay | Admitting: Emergency Medicine

## 2022-02-04 ENCOUNTER — Emergency Department (HOSPITAL_BASED_OUTPATIENT_CLINIC_OR_DEPARTMENT_OTHER): Payer: BLUE CROSS/BLUE SHIELD

## 2022-02-04 ENCOUNTER — Emergency Department (HOSPITAL_BASED_OUTPATIENT_CLINIC_OR_DEPARTMENT_OTHER)
Admission: EM | Admit: 2022-02-04 | Discharge: 2022-02-04 | Disposition: A | Discharge status: Home / Self Care | Payer: BLUE CROSS/BLUE SHIELD | Attending: Emergency Medicine | Admitting: Emergency Medicine

## 2022-02-04 ENCOUNTER — Emergency Department (HOSPITAL_BASED_OUTPATIENT_CLINIC_OR_DEPARTMENT_OTHER): Payer: BLUE CROSS/BLUE SHIELD | Admitting: Radiology

## 2022-02-04 DIAGNOSIS — R0789 Other chest pain: Secondary | ICD-10-CM | POA: Diagnosis not present

## 2022-02-04 DIAGNOSIS — R451 Restlessness and agitation: Secondary | ICD-10-CM | POA: Insufficient documentation

## 2022-02-04 DIAGNOSIS — Z1152 Encounter for screening for COVID-19: Secondary | ICD-10-CM | POA: Insufficient documentation

## 2022-02-04 DIAGNOSIS — R062 Wheezing: Secondary | ICD-10-CM | POA: Diagnosis not present

## 2022-02-04 DIAGNOSIS — R0682 Tachypnea, not elsewhere classified: Secondary | ICD-10-CM | POA: Insufficient documentation

## 2022-02-04 LAB — CBC WITH DIFFERENTIAL/PLATELET
Abs Immature Granulocytes: 0.02 10*3/uL (ref 0.00–0.07)
Basophils Absolute: 0 10*3/uL (ref 0.0–0.1)
Basophils Relative: 1 %
Eosinophils Absolute: 0.1 10*3/uL (ref 0.0–0.5)
Eosinophils Relative: 2 %
HCT: 36.3 % (ref 36.0–46.0)
Hemoglobin: 11.4 g/dL — ABNORMAL LOW (ref 12.0–15.0)
Immature Granulocytes: 0 %
Lymphocytes Relative: 36 %
Lymphs Abs: 2.6 10*3/uL (ref 0.7–4.0)
MCH: 26.3 pg (ref 26.0–34.0)
MCHC: 31.4 g/dL (ref 30.0–36.0)
MCV: 83.6 fL (ref 80.0–100.0)
Monocytes Absolute: 0.7 10*3/uL (ref 0.1–1.0)
Monocytes Relative: 10 %
Neutro Abs: 3.6 10*3/uL (ref 1.7–7.7)
Neutrophils Relative %: 51 %
Platelets: 270 10*3/uL (ref 150–400)
RBC: 4.34 MIL/uL (ref 3.87–5.11)
RDW: 13.2 % (ref 11.5–15.5)
WBC: 7.1 10*3/uL (ref 4.0–10.5)
nRBC: 0 % (ref 0.0–0.2)

## 2022-02-04 LAB — COMPREHENSIVE METABOLIC PANEL
ALT: 13 U/L (ref 0–44)
AST: 18 U/L (ref 15–41)
Albumin: 4.2 g/dL (ref 3.5–5.0)
Alkaline Phosphatase: 30 U/L — ABNORMAL LOW (ref 38–126)
Anion gap: 12 (ref 5–15)
BUN: 10 mg/dL (ref 6–20)
CO2: 22 mmol/L (ref 22–32)
Calcium: 9.4 mg/dL (ref 8.9–10.3)
Chloride: 109 mmol/L (ref 98–111)
Creatinine, Ser: 0.82 mg/dL (ref 0.44–1.00)
GFR, Estimated: >60 mL/min (ref >60)
Glucose, Bld: 99 mg/dL (ref 70–99)
Potassium: 3.5 mmol/L (ref 3.5–5.1)
Sodium: 143 mmol/L (ref 135–145)
Total Bilirubin: 0.4 mg/dL (ref 0.3–1.2)
Total Protein: 8.1 g/dL (ref 6.5–8.1)

## 2022-02-04 LAB — RESP PANEL BY RT-PCR (RSV, FLU A&B, COVID)  RVPGX2
Influenza A by PCR: NEGATIVE
Influenza B by PCR: NEGATIVE
Resp Syncytial Virus by PCR: NEGATIVE
SARS Coronavirus 2 by RT PCR: NEGATIVE

## 2022-02-04 LAB — TROPONIN I (HIGH SENSITIVITY): Troponin I (High Sensitivity): <2 ng/L (ref <18)

## 2022-02-04 MED ORDER — LORAZEPAM 2 MG/ML IJ SOLN
INTRAMUSCULAR | Status: AC
  Administered 2022-02-04: 2 mg
  Filled 2022-02-04: qty 1

## 2022-02-04 MED ORDER — ALPRAZOLAM 0.5 MG PO TABS
0.5000 mg | ORAL_TABLET | Freq: Once | ORAL | Status: AC
  Administered 2022-02-04: 0.5 mg via ORAL
  Filled 2022-02-04: qty 1

## 2022-02-04 MED ORDER — ALBUTEROL SULFATE (2.5 MG/3ML) 0.083% IN NEBU
2.5000 mg | INHALATION_SOLUTION | Freq: Once | RESPIRATORY_TRACT | Status: AC
Start: 2022-02-04 — End: 2022-02-04
  Administered 2022-02-04: 2.5 mg via RESPIRATORY_TRACT
  Filled 2022-02-04: qty 3

## 2022-02-04 NOTE — ED Notes (Addendum)
Pt able to sit on bed for husband to dress. Pt able to stand to get in car. Pt was not in distress, resp appeared normal as discharged placed in wheel chair and while waiting for husband to bring vehicle to front. and pt could follow commands and acknowledge questions appropriately. Husband encouraged to bring wife back if necessary.  Consulted with provider before discharge

## 2022-02-04 NOTE — ED Notes (Signed)
Back from CT

## 2022-02-04 NOTE — ED Provider Notes (Signed)
[facility] Provider Note   CSN: [id number] Arrival date & time: 02/04/22  1914     History  Chief Complaint  Patient presents with   Shortness of Breath    Ellen Hunter is a 42 y.o. female.  HPI   42 year old female presents to the emergency department with concern for shortness of breath.  She is accompanied by her husband.  History is limited secondary to heavy fast breathing and tearfulness.  The husband admits to severe anxiety/panic attacks although is never seen or have 1 like this.  Patient is complaining of midsternal chest discomfort.   Home Medications Prior to Admission medications   Medication Sig Start Date End Date Taking? Authorizing Provider  clindamycin (CLEOCIN) 300 MG capsule Take 1 capsule (300 mg total) by mouth 4 (four) times daily. X 7 days 01/25/18   Noe Gens, PA-C  ipratropium (ATROVENT) 0.06 % nasal spray Place 2 sprays into both nostrils 4 (four) times daily. 01/25/18   Noe Gens, PA-C  metroNIDAZOLE (METROCREAM) 0.75 % cream Apply topically 2 (two) times daily. 12/30/19   Drenda Freeze, MD  omeprazole (PRILOSEC) 20 MG capsule Take 1 capsule (20 mg total) by mouth daily. 01/24/21   Redwine, Madison A, PA-C  triamcinolone cream (KENALOG) 0.1 % Apply 1 application topically 2 (two) times daily. 01/24/21   Redwine, Madison A, PA-C      Allergies    Amoxicillin, Blueberry flavor, and Penicillins    Review of Systems   Review of Systems  Constitutional:  Positive for fatigue. Negative for fever.  Respiratory:  Positive for chest tightness and shortness of breath.   Cardiovascular:  Positive for chest pain.  Gastrointestinal:  Negative for abdominal pain, diarrhea and vomiting.  Musculoskeletal:  Negative for back pain.  Skin:  Negative for rash.  Neurological:  Negative for dizziness, syncope, weakness and headaches.    Physical Exam Updated Vital Signs BP (!) 147/98   Pulse 88   Temp  98.9 F (37.2 C) (Oral)   Resp 15   Ht 5\' 7"  (1.702 m)   Wt 135.6 kg   SpO2 98%   BMI 46.83 kg/m  Physical Exam Vitals and nursing note reviewed.  Constitutional:      Appearance: Normal appearance. She is obese.  HENT:     Head: Normocephalic.     Mouth/Throat:     Mouth: Mucous membranes are moist.  Eyes:     Extraocular Movements: Extraocular movements intact.     Pupils: Pupils are equal, round, and reactive to light.  Cardiovascular:     Rate and Rhythm: Normal rate.  Pulmonary:     Effort: Pulmonary effort is normal. Tachypnea present.     Breath sounds: Wheezing present.  Abdominal:     Palpations: Abdomen is soft.     Tenderness: There is no abdominal tenderness.  Skin:    General: Skin is warm.  Neurological:     Mental Status: She is alert and oriented to person, place, and time. Mental status is at baseline.  Psychiatric:        Mood and Affect: Mood is anxious.        Behavior: Behavior is agitated.     ED Results / Procedures / Treatments   Labs (all labs ordered are listed, but only abnormal results are displayed) Labs Reviewed  CBC WITH DIFFERENTIAL/PLATELET - Abnormal; Notable for the following components:      Result Value   Hemoglobin  11.4 (*)    All other components within normal limits  COMPREHENSIVE METABOLIC PANEL - Abnormal; Notable for the following components:   Alkaline Phosphatase 30 (*)    All other components within normal limits  RESP PANEL BY RT-PCR (RSV, FLU A&B, COVID)  [id number]  TROPONIN I (HIGH SENSITIVITY)    EKG None  Radiology No results found.  Procedures Ultrasound ED Peripheral IV (Provider)  Date/Time: 02/04/2022 10:34 AM  Performed by: Lorelle Gibbs, DO Authorized by: Lorelle Gibbs, DO   Procedure details:    Indications: poor IV access     Skin Prep: isopropyl alcohol     Location:  Left AC   Angiocath:  20 G   Bedside Ultrasound Guided: Yes     Images: not archived     Patient tolerated procedure  without complications: Yes     Dressing applied: Yes       Medications Ordered in ED Medications  albuterol (PROVENTIL) (2.5 MG/3ML) 0.083% nebulizer solution 2.5 mg (2.5 mg Nebulization Given 02/04/22 0914)  ALPRAZolam Duanne Moron) tablet 0.5 mg (0.5 mg Oral Given 02/04/22 0918)  LORazepam (ATIVAN) 2 MG/ML injection (2 mg  Given 02/04/22 4696)    ED Course/ Medical Decision Making/ A&P                             Medical Decision Making Amount and/or Complexity of Data Reviewed Labs: ordered. Radiology: ordered.  Risk Prescription drug management.   42 year old female presented to the emergency department complaining of anxiety and shortness of breath.  On my evaluation she was tachypneic, tearful, scattered wheezing.  I initially ordered a breathing treatment but when I stepped out of the room staff called me back to bedside for "seizure-like activity".  They describe this as her eyes rolling in the back of her head, shaking.  On my evaluation patient was eyes open, localizing, following commands.  Patient continues to have left-sided arm shaking, however inconsistent, so IM Ativan was given.  Husband at bedside states that she has severe anxiety and panic attacks.  After discussing with her aunt she is mention that she has had shaking ,episodes like this before due to anxiety.  Unclear if this was genuine seizure-like activity.  However secondary to the activity we did do seizure workup.  EKG was normal sinus rhythm.  Head CT was unremarkable but did mention a possible empty sella.  Patient has no symptoms in regards to this including headache, vision changes.  After IM medicine patient feels improved.  Blood work is reassuring.  Given the read of empty sella with possible seizure activity I spoke with neurology, Dr. Quinn Axe.  They agree that there is nothing more to be done in this regards.  She can follow-up as an outpatient with neurology.  Doubt that this was genuine seizure-like  activity but I have spoken with the patient about seizure precautions.  She will follow-up as an outpatient with neurology and have given her resources for behavioral support services.  Patient is at baseline mentation, eating and drinking.  Patient at this time appears safe and stable for discharge and close outpatient follow up. Discharge plan and strict return to ED precautions discussed, patient verbalizes understanding and agreement.        Final Clinical Impression(s) / ED Diagnoses Final diagnoses:  None    Rx / DC Orders ED Discharge Orders     None  Lorelle Gibbs, DO 02/04/22 1319

## 2022-02-04 NOTE — ED Notes (Signed)
ED Provider at bedside. 

## 2022-02-04 NOTE — ED Triage Notes (Signed)
Pt from home and c/o SOB that started about 15 minutes ago. Pt  states that she is having a panic attack. Pt states she is sob and tightness in chest.

## 2022-02-04 NOTE — Discharge Instructions (Addendum)
You have been seen and discharged from the emergency department.  Your heart and lung workup was normal today.  The CT of your head showed no acute findings.  However with the witnessed activity here in the ER it is recommended that you follow-up with neurology as an outpatient.  Follow-up with your primary provider for further evaluation and further care.  Information for behavioral health center has been attached as well for further out reach of anxiety symptoms.  Take home medications as prescribed. If you have any worsening symptoms or further concerns for your health please return to an emergency department for further evaluation.

## 2022-02-04 NOTE — ED Notes (Signed)
Patient transported to CT 

## 2022-02-04 NOTE — ED Notes (Signed)
Fluids given.

## 2022-02-04 NOTE — ED Notes (Signed)
Verbal order read back via provider for 2 mg IM

## 2022-02-04 NOTE — ED Notes (Signed)
Pt drank coupple sips of water. Pt follows commands

## 2022-02-04 NOTE — Progress Notes (Signed)
RT went in the room and the Pt wasn't able to talk and her head was shaking. He husband said that she thought it was a panic attack. The doctor came in anne valuated the Pt and ordered a breathing [place] and something for he panic attack. The nurse gave the Pt a pill and she wasn't able to swallow the water and she went unresponsive and her oxygen went down to 78%. RT placed the Pt on oxygen and the doctor came in and reassessed the Pt. RT will continue to monitor

## 2022-02-15 ENCOUNTER — Encounter (HOSPITAL_COMMUNITY): Payer: Self-pay

## 2022-02-15 ENCOUNTER — Emergency Department (HOSPITAL_COMMUNITY)
Admission: EM | Admit: 2022-02-15 | Discharge: 2022-02-16 | Disposition: A | Discharge status: Home / Self Care | Payer: BLUE CROSS/BLUE SHIELD | Attending: Emergency Medicine | Admitting: Emergency Medicine

## 2022-02-15 ENCOUNTER — Other Ambulatory Visit: Payer: Self-pay

## 2022-02-15 DIAGNOSIS — I83899 Varicose veins of unspecified lower extremities with other complications: Secondary | ICD-10-CM

## 2022-02-15 DIAGNOSIS — R55 Syncope and collapse: Secondary | ICD-10-CM

## 2022-02-15 DIAGNOSIS — I83892 Varicose veins of left lower extremities with other complications: Secondary | ICD-10-CM | POA: Diagnosis present

## 2022-02-15 LAB — BASIC METABOLIC PANEL
Anion gap: 6 (ref 5–15)
BUN: 7 mg/dL (ref 6–20)
CO2: 24 mmol/L (ref 22–32)
Calcium: 8.4 mg/dL — ABNORMAL LOW (ref 8.9–10.3)
Chloride: 106 mmol/L (ref 98–111)
Creatinine, Ser: 0.86 mg/dL (ref 0.44–1.00)
GFR, Estimated: >60 mL/min (ref >60)
Glucose, Bld: 110 mg/dL — ABNORMAL HIGH (ref 70–99)
Potassium: 3.8 mmol/L (ref 3.5–5.1)
Sodium: 136 mmol/L (ref 135–145)

## 2022-02-15 LAB — CBC WITH DIFFERENTIAL/PLATELET
Abs Immature Granulocytes: 0.03 10*3/uL (ref 0.00–0.07)
Basophils Absolute: 0 10*3/uL (ref 0.0–0.1)
Basophils Relative: 1 %
Eosinophils Absolute: 0.1 10*3/uL (ref 0.0–0.5)
Eosinophils Relative: 1 %
HCT: 32.5 % — ABNORMAL LOW (ref 36.0–46.0)
Hemoglobin: 10.3 g/dL — ABNORMAL LOW (ref 12.0–15.0)
Immature Granulocytes: 0 %
Lymphocytes Relative: 23 %
Lymphs Abs: 1.9 10*3/uL (ref 0.7–4.0)
MCH: 27 pg (ref 26.0–34.0)
MCHC: 31.7 g/dL (ref 30.0–36.0)
MCV: 85.3 fL (ref 80.0–100.0)
Monocytes Absolute: 1 10*3/uL (ref 0.1–1.0)
Monocytes Relative: 12 %
Neutro Abs: 5.5 10*3/uL (ref 1.7–7.7)
Neutrophils Relative %: 63 %
Platelets: 268 10*3/uL (ref 150–400)
RBC: 3.81 MIL/uL — ABNORMAL LOW (ref 3.87–5.11)
RDW: 13.2 % (ref 11.5–15.5)
WBC: 8.6 10*3/uL (ref 4.0–10.5)
nRBC: 0 % (ref 0.0–0.2)

## 2022-02-15 LAB — CBG MONITORING, ED: Glucose-Capillary: 117 mg/dL — ABNORMAL HIGH (ref 70–99)

## 2022-02-15 MED ORDER — LIDOCAINE-EPINEPHRINE (PF) 2 %-1:200000 IJ SOLN
10.0000 mL | Freq: Once | INTRAMUSCULAR | Status: AC
  Administered 2022-02-15: 10 mL
  Filled 2022-02-15: qty 20

## 2022-02-15 NOTE — ED Triage Notes (Signed)
Pt passed out after losing "approx 1 liter of blood" d/t Varicose vein on LLE.  Pt was not responsive to EMS but is now.  Pt is A&O x4

## 2022-02-15 NOTE — ED Provider Notes (Signed)
[facility] Provider Note   CSN: [id number] Arrival date & time: 02/15/22  2211     History {Add pertinent medical, surgical, social history, OB history to HPI:1} Chief Complaint  Patient presents with   Loss of Consciousness    Ellen Hunter is a 42 y.o. female.  42 year old female with a history of obesity presents to the emergency department for evaluation of syncope.  Per husband, patient lost consciousness after persistent bleeding from a varicose vein.  Patient states that she was drying herself off after shower when her varicose vein began to spontaneously bleed.  Patient and spouse allege approximately 1 L of blood loss.  Husband tried to apply pressure prior to EMS arrival.  She was given 250 cc IV fluids in transport.   Loss of Consciousness      Home Medications Prior to Admission medications   Medication Sig Start Date End Date Taking? Authorizing Provider  albuterol (PROVENTIL) (2.5 MG/3ML) 0.083% nebulizer solution Take by nebulization. 02/02/22   [provider]  clindamycin (CLEOCIN) 300 MG capsule Take 1 capsule (300 mg total) by mouth 4 (four) times daily. X 7 days 01/25/18   Noe Gens, PA-C  ipratropium (ATROVENT) 0.06 % nasal spray Place 2 sprays into both nostrils 4 (four) times daily. 01/25/18   Noe Gens, PA-C  metroNIDAZOLE (METROCREAM) 0.75 % cream Apply topically 2 (two) times daily. 12/30/19   Drenda Freeze, MD  omeprazole (PRILOSEC) 20 MG capsule Take 1 capsule (20 mg total) by mouth daily. 01/24/21   Redwine, Madison A, PA-C  triamcinolone cream (KENALOG) 0.1 % Apply 1 application topically 2 (two) times daily. 01/24/21   Redwine, Madison A, PA-C      Allergies    Amoxicillin, Blueberry flavor, and Penicillins    Review of Systems   Review of Systems  Cardiovascular:  Positive for syncope.  Ten systems reviewed and are negative for acute change, except as noted in the HPI.     Physical Exam Updated Vital Signs BP (!) 176/108   Pulse 76   Temp 99.9 F (37.7 C) (Oral)   Resp 20   Ht 5' 7"$  (1.702 m)   Wt 135.6 kg   SpO2 98%   BMI 46.82 kg/m   Physical Exam Vitals and nursing note reviewed.  Constitutional:      General: She is not in acute distress.    Appearance: She is well-developed. She is not diaphoretic.     Comments: Nontoxic appearing, obese AA female  HENT:     Head: Normocephalic and atraumatic.  Eyes:     General: No scleral icterus.    Conjunctiva/sclera: Conjunctivae normal.  Cardiovascular:     Rate and Rhythm: Normal rate and regular rhythm.     Pulses: Normal pulses.  Pulmonary:     Effort: Pulmonary effort is normal. No respiratory distress.     Comments: Respirations even and unlabored Musculoskeletal:        General: Normal range of motion.     Cervical back: Normal range of motion.     Comments: Varicose vein to the lateral LLE. Initially without bleeding. Shortly after take down of dressing, bleeding resumed. Bleeding bright red and steady, persistent despite pressure dressing. No palpable, pulsatile bleeding.  Skin:    General: Skin is warm and dry.     Coloration: Skin is not pale.     Findings: No erythema or rash.  Neurological:     Mental  Status: She is alert and oriented to person, place, and time.     Coordination: Coordination normal.  Psychiatric:        Behavior: Behavior normal.     ED Results / Procedures / Treatments   Labs (all labs ordered are listed, but only abnormal results are displayed) Labs Reviewed  BASIC METABOLIC PANEL - Abnormal; Notable for the following components:      Result Value   Glucose, Bld 110 (*)    Calcium 8.4 (*)    All other components within normal limits  CBC WITH DIFFERENTIAL/PLATELET - Abnormal; Notable for the following components:   RBC 3.81 (*)    Hemoglobin 10.3 (*)    HCT 32.5 (*)    All other components within normal limits  CBG MONITORING, ED - Abnormal;  Notable for the following components:   Glucose-Capillary 117 (*)    All other components within normal limits  I-STAT BETA HCG BLOOD, ED ([facility], [facility], [facility] ONLY)    EKG EKG Interpretation  Date/Time:  Sunday February 15 2022 23:16:04 EST Ventricular Rate:  70 PR Interval:  176 QRS Duration: 85 QT Interval:  405 QTC Calculation: 437 R Axis:   49 Text Interpretation: Sinus rhythm No significant change was found Confirmed by Addison Lank [id number]) on 02/15/2022 11:22:04 PM  Radiology No results found.  Procedures .Marland KitchenLaceration Repair  Date/Time: 02/15/2022 11:28 PM  Performed by: Antonietta Breach, PA-C Authorized by: Antonietta Breach, PA-C   Consent:    Consent obtained:  Verbal   Consent given by:  Patient   Risks, benefits, and alternatives were discussed: yes     Risks discussed:  Need for additional repair, poor cosmetic result and poor wound healing   Alternatives discussed:  No treatment Universal protocol:    Procedure explained and questions answered to patient or proxy's satisfaction: yes     Relevant documents present and verified: yes     Test results available: yes     Imaging studies available: yes     Required blood products, implants, devices, and special equipment available: yes     Patient identity confirmed:  Verbally with patient and arm band Anesthesia:    Anesthesia method:  Local infiltration   Local anesthetic:  Lidocaine 2% WITH epi Laceration details:    Location:  Leg   Leg location:  L lower leg Exploration:    Hemostasis achieved with:  Direct pressure   Imaging outcome: foreign body not noted     Contaminated: no   Treatment:    Area cleansed with:  Povidone-iodine Skin repair:    Repair method:  Sutures   Suture size:  4-0   Suture material:  Chromic gut   Suture technique:  Figure eight   Number of sutures:  1 Approximation:    Approximation:  Close Repair type:    Repair type:  Simple Post-procedure details:    Dressing:  Non-adherent  dressing   Procedure completion:  Tolerated well, no immediate complications Comments:     Figure 8 suture for hemostasis management of bleeding varicose vein   {Document cardiac monitor, telemetry assessment procedure when appropriate:1}  Medications Ordered in ED Medications  lidocaine-EPINEPHrine (XYLOCAINE W/EPI) 2 %-1:200000 (PF) injection 10 mL (10 mLs Infiltration Given by Other 02/15/22 2314)    ED Course/ Medical Decision Making/ A&P   {   Click here for ABCD2, HEART and other calculatorsREFRESH Note before signing :1}  Medical Decision Making Amount and/or Complexity of Data Reviewed Labs: ordered. ECG/medicine tests: ordered.  Risk Prescription drug management.   ***  {Document critical care time when appropriate:1} {Document review of labs and clinical decision tools ie heart score, Chads2Vasc2 etc:1}  {Document your independent review of radiology images, and any outside records:1} {Document your discussion with family members, caretakers, and with consultants:1} {Document social determinants of health affecting pt's care:1} {Document your decision making why or why not admission, treatments were needed:1} Final Clinical Impression(s) / ED Diagnoses Final diagnoses:  None    Rx / DC Orders ED Discharge Orders     None

## 2022-02-16 NOTE — Discharge Instructions (Signed)
Your hemoglobin in the ED today was reassuring.  We recommend follow-up with your primary care doctor as needed.  Return to the ED for new or concerning symptoms.

## 2022-08-24 IMAGING — CT CT ABD-PELV W/ CM
2 of 4 series · 17 of 46 positions shown, 19 images · IV contrast (agent unspecified)
Comparison: None.

CLINICAL DATA: Acute abdominal pain.

EXAM:
CT ABDOMEN AND PELVIS WITH CONTRAST
TECHNIQUE: Multidetector CT imaging of the abdomen and pelvis was performed
using the standard protocol following bolus administration of
intravenous contrast.

[Series 2: axial st · axial · 0.98mm/px · z∈[-449,-9]mm · 14 of 96 slices shown, 16 images]
[im 4/96  soft-tissue]
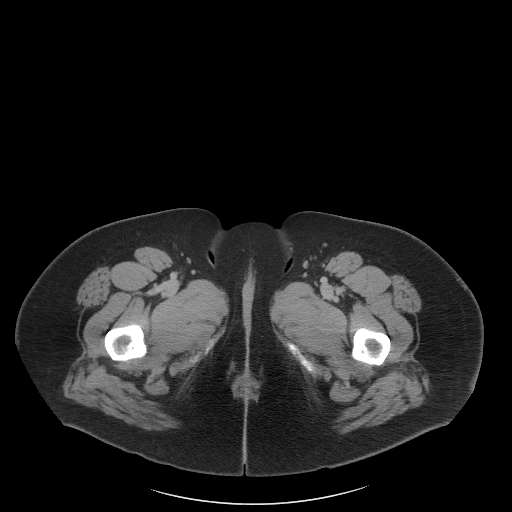
[im 4/96  bone]
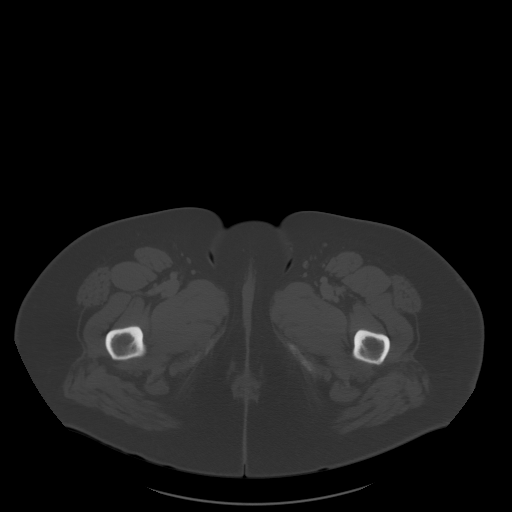
[im 12/96  soft-tissue]
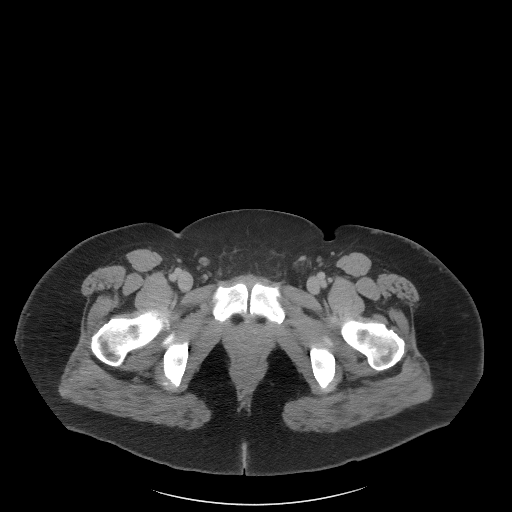
[im 20/96  soft-tissue]
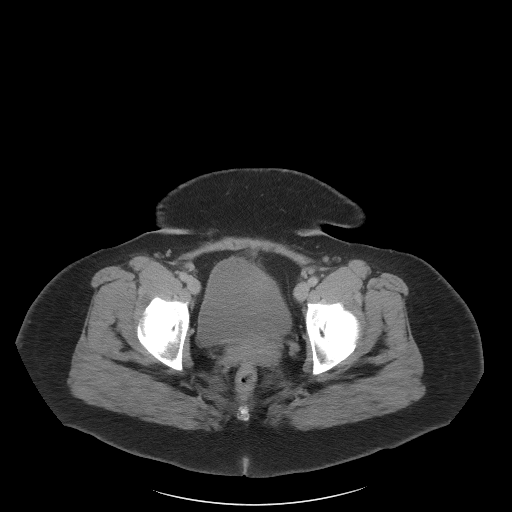
[im 27/96  soft-tissue]
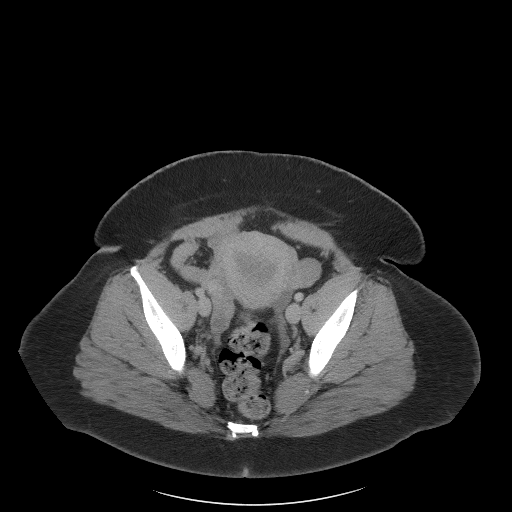
[im 31/96  soft-tissue]
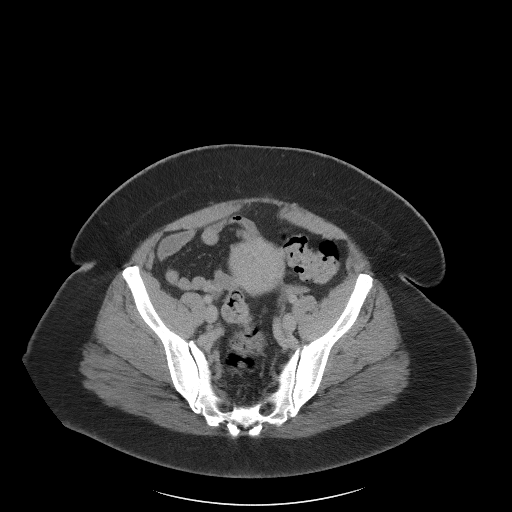
[im 39/96  soft-tissue]
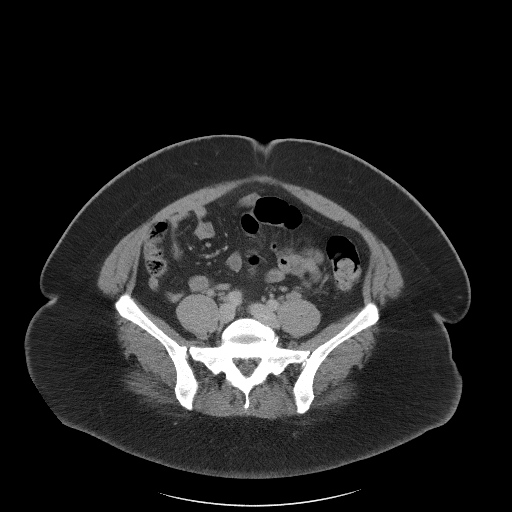
[im 46/96  soft-tissue]
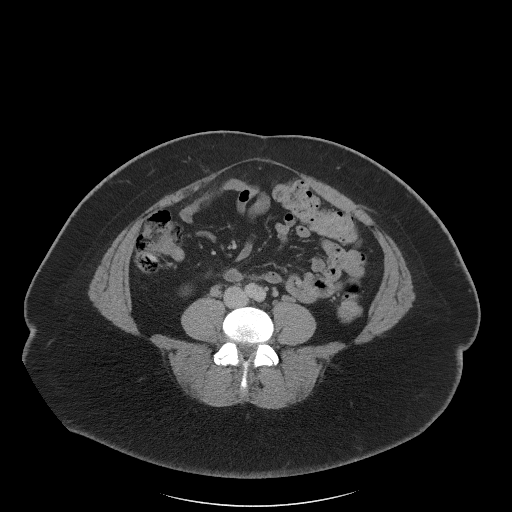
[im 50/96  soft-tissue]
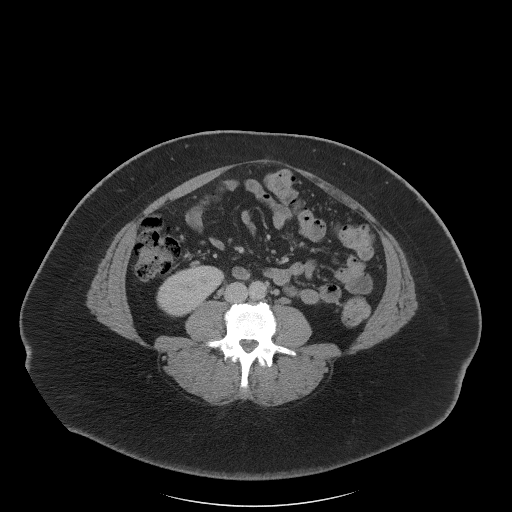
[im 58/96  soft-tissue]
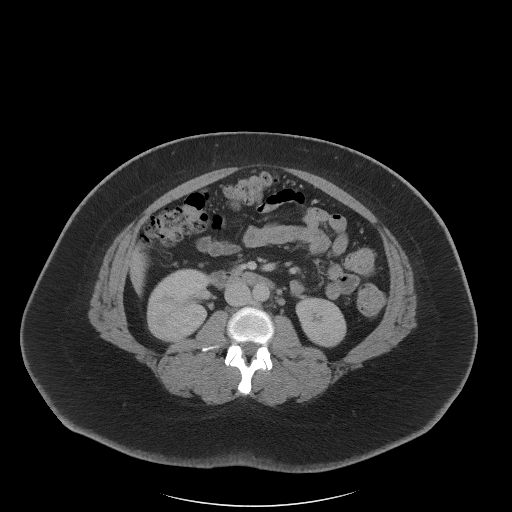
[im 58/96  bone]
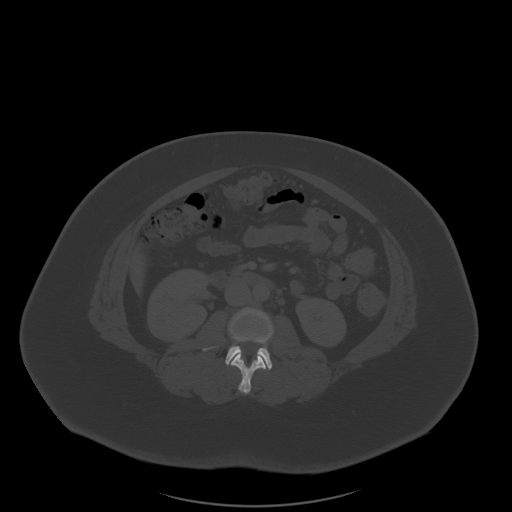
[im 65/96  soft-tissue]
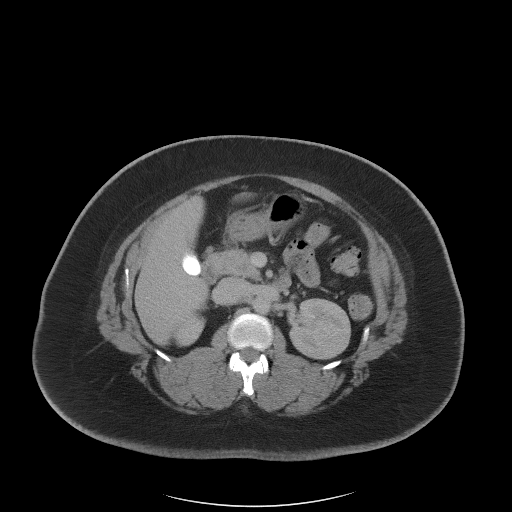
[im 73/96  soft-tissue]
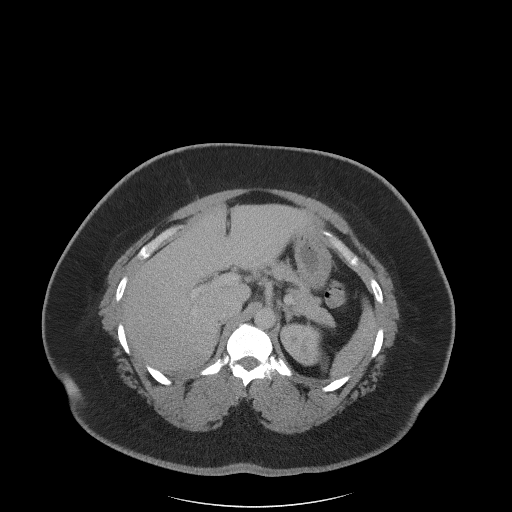
[im 77/96  soft-tissue]
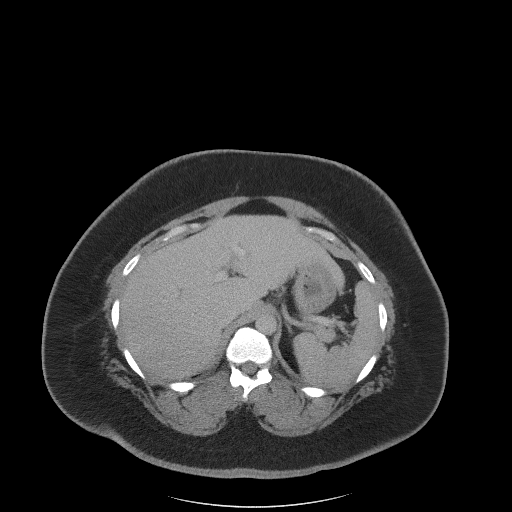
[im 84/96  soft-tissue]
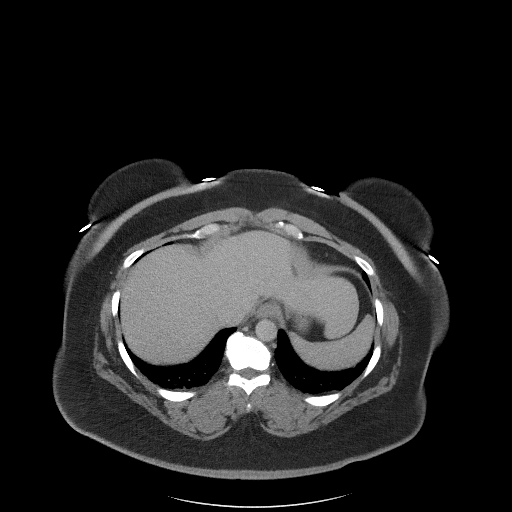
[im 92/96  soft-tissue]
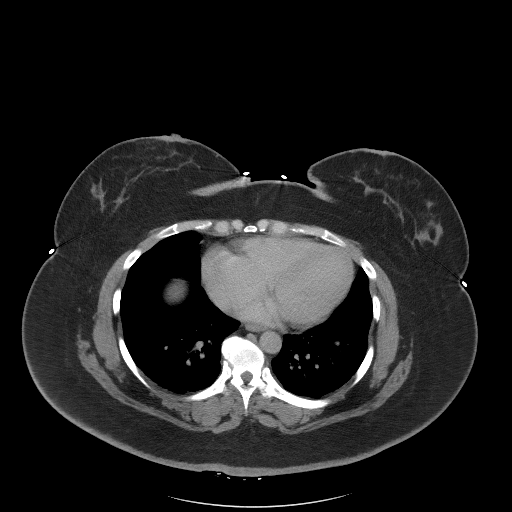

[Series 5: coronal st · coronal · 0.90mm/px · 3 of 115 slices shown]
[im 39/115  soft-tissue]
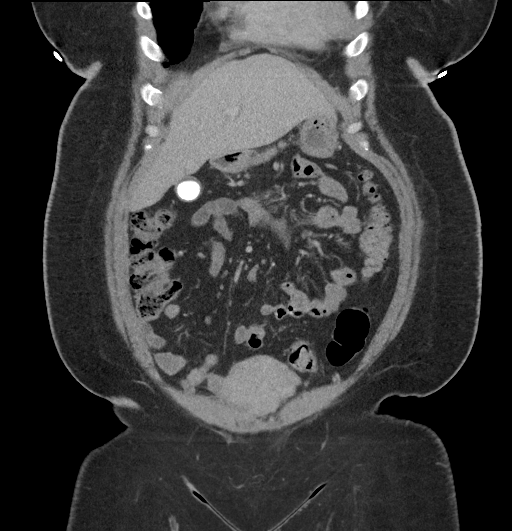
[im 51/115  soft-tissue]
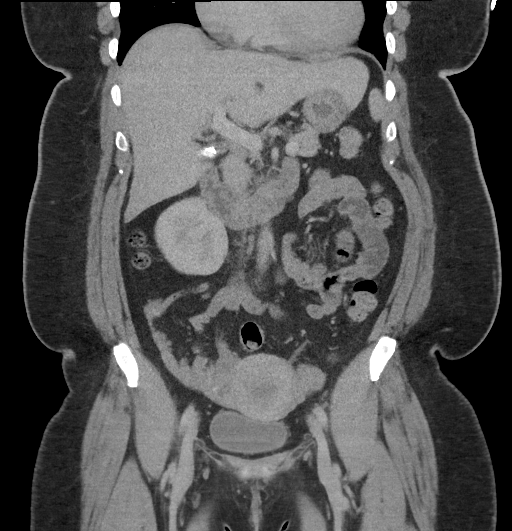
[im 64/115  soft-tissue]
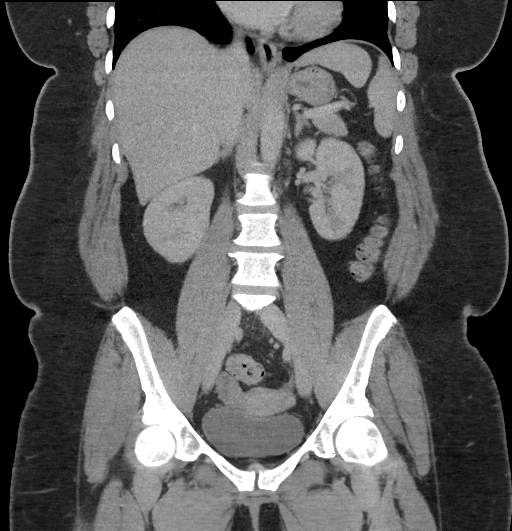

[17 of 46 positions shown; findings below may reference images not displayed]

RADIATION DOSE REDUCTION: This exam was performed according to the
departmental dose-optimization program which includes automated
exposure control, adjustment of the mA and/or kV according to
patient size and/or use of iterative reconstruction technique.

CONTRAST:  100mL OMNIPAQUE IOHEXOL 300 MG/ML  SOLN
FINDINGS: Lower chest: No acute abnormality.

Hepatobiliary: Multiple gallstones are identified measuring up to 4
cm. There is no pericholecystic inflammation. There is no biliary
ductal dilatation. The liver is within normal limits.

Pancreas: Unremarkable. No pancreatic ductal dilatation or
surrounding inflammatory changes.

Spleen: Normal in size without focal abnormality.

Adrenals/Urinary Tract: Adrenal glands are unremarkable. Kidneys are
normal, without renal calculi, focal lesion, or hydronephrosis.
Bladder is unremarkable.

Stomach/Bowel: Stomach is within normal limits. Appendix appears
normal. No evidence of bowel wall thickening, distention, or
inflammatory changes.

Vascular/Lymphatic: No significant vascular findings are present. No
enlarged abdominal or pelvic lymph nodes.

Reproductive: Uterus and bilateral adnexa are unremarkable.

Other: No abdominal wall hernia or abnormality. No abdominopelvic
ascites.

Musculoskeletal: No fracture is seen.
IMPRESSION: 1. Cholelithiasis.
2. No acute localizing process in the abdomen or pelvis.
